# Patient Record
Sex: Male | Born: 1945 | Race: White | Hispanic: No | Marital: Married | Smoking: Never smoker
Health system: Southern US, Academic
[De-identification: ages and names within clinical notes are randomized; demographics above are authoritative.]

## PROBLEM LIST (undated history)

## (undated) DIAGNOSIS — C4492 Squamous cell carcinoma of skin, unspecified: Secondary | ICD-10-CM

## (undated) DIAGNOSIS — R001 Bradycardia, unspecified: Secondary | ICD-10-CM

## (undated) DIAGNOSIS — E041 Nontoxic single thyroid nodule: Secondary | ICD-10-CM

## (undated) DIAGNOSIS — E119 Type 2 diabetes mellitus without complications: Secondary | ICD-10-CM

## (undated) DIAGNOSIS — I10 Essential (primary) hypertension: Secondary | ICD-10-CM

## (undated) DIAGNOSIS — I719 Aortic aneurysm of unspecified site, without rupture: Secondary | ICD-10-CM

## (undated) DIAGNOSIS — K219 Gastro-esophageal reflux disease without esophagitis: Secondary | ICD-10-CM

## (undated) DIAGNOSIS — M5030 Other cervical disc degeneration, unspecified cervical region: Secondary | ICD-10-CM

## (undated) DIAGNOSIS — I519 Heart disease, unspecified: Secondary | ICD-10-CM

## (undated) HISTORY — PX: KNEE ARTHROSCOPY W/ MENISCAL REPAIR: SHX1877

## (undated) HISTORY — DX: Nontoxic single thyroid nodule: E04.1

## (undated) HISTORY — DX: Bradycardia, unspecified: R00.1

## (undated) HISTORY — DX: Type 2 diabetes mellitus without complications: E11.9

## (undated) HISTORY — PX: SKIN LESION EXCISION: SHX2412

## (undated) HISTORY — DX: Squamous cell carcinoma of skin, unspecified: C44.92

## (undated) HISTORY — DX: Essential (primary) hypertension: I10

## (undated) HISTORY — PX: COLONOSCOPY: SHX174

## (undated) HISTORY — DX: Heart disease, unspecified: I51.9

## (undated) HISTORY — DX: Gastro-esophageal reflux disease without esophagitis: K21.9

## (undated) HISTORY — DX: Other cervical disc degeneration, unspecified cervical region: M50.30

---

## 1996-07-15 ENCOUNTER — Other Ambulatory Visit (HOSPITAL_COMMUNITY): Payer: Self-pay

## 2019-08-30 IMAGING — MR MRI UPPER EXTREMITY W/O RT
4 of 7 series · 19 of 40 positions shown · IV contrast (gadolinium)
Comparison: Radiographs from outside facility dated 08/25/2019.

EXAM:  MRI UPPER EXTREMITY W/O RT
INDICATION: Lytic bone lesions. Remote history of melanoma.
TECHNIQUE: Multiplanar multisequential MRI of the right forearm was performed without gadolinium contrast.

[Series 10: T2 fat-sat · axial · right · 6.5mm · 0.39mm/px · z∈[-152,+125]mm · 8 of 38 slices shown]
[im 1/38]
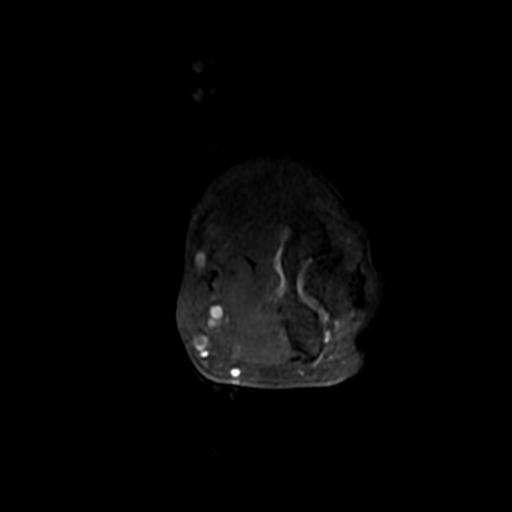
[im 6/38]
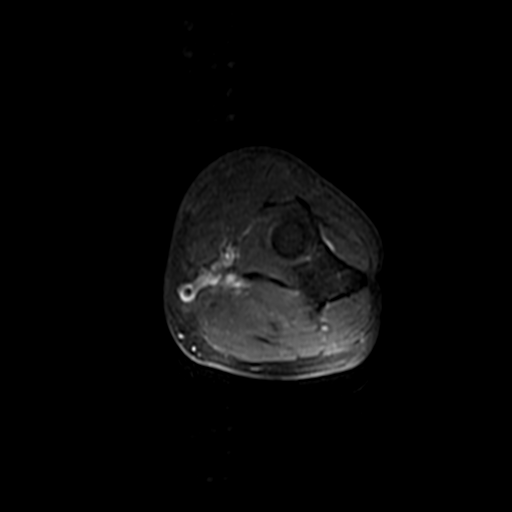
[im 11/38]
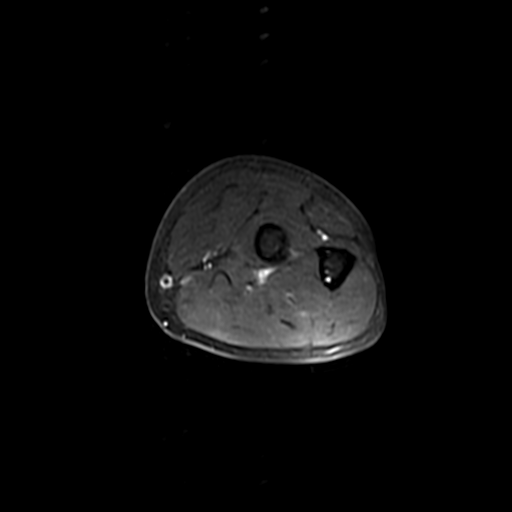
[im 16/38]
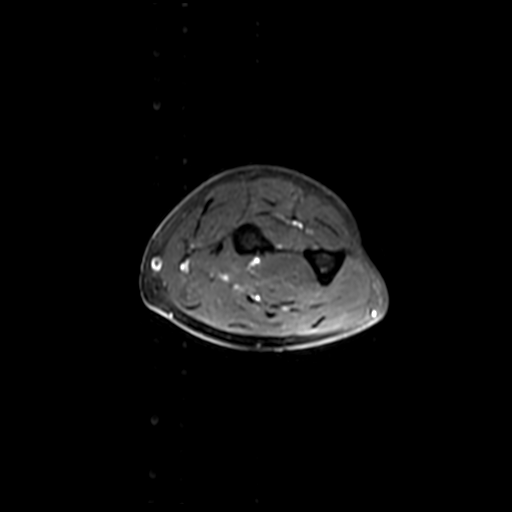
[im 22/38]
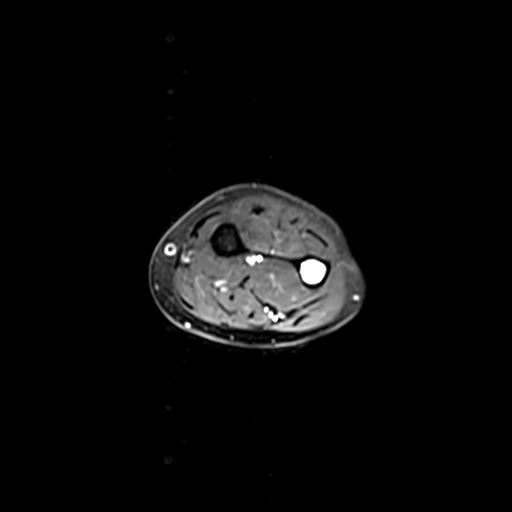
[im 27/38]
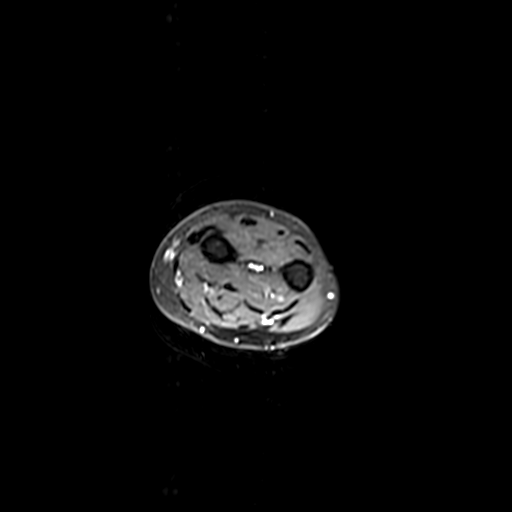
[im 32/38]
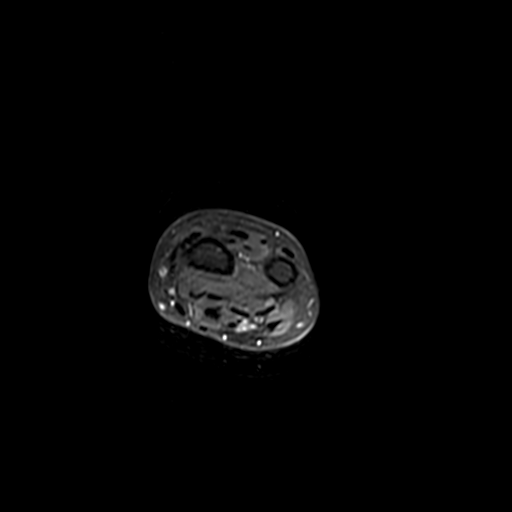
[im 38/38]
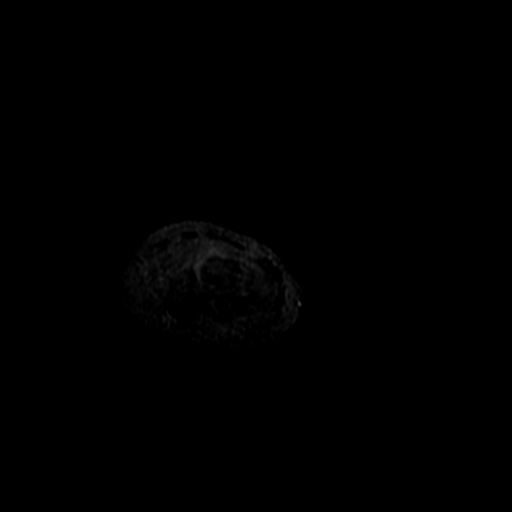

[Series 11: T1 · axial · right · 6.5mm · 0.39mm/px · z∈[-152,+80]mm · 5 of 38 slices shown (1 of 3)]
[im 1/38]
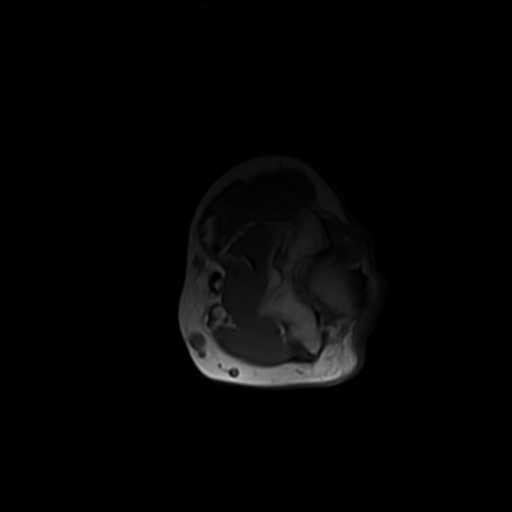
[im 6/38]
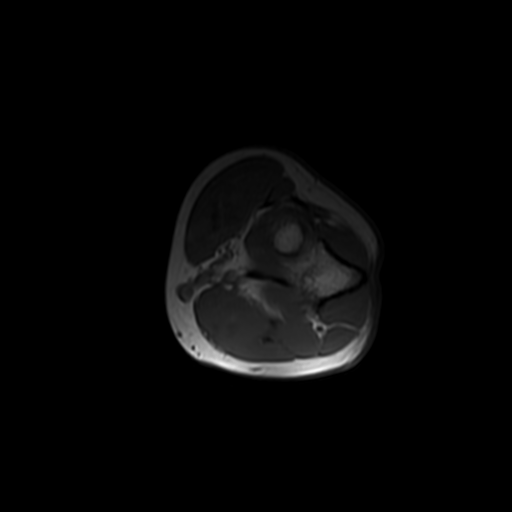
[im 11/38]
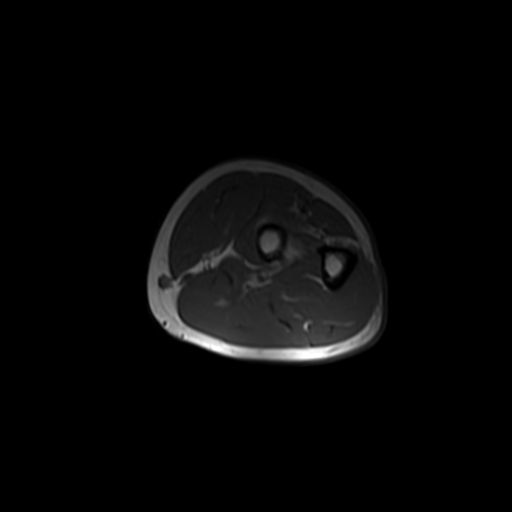
[im 22/38]
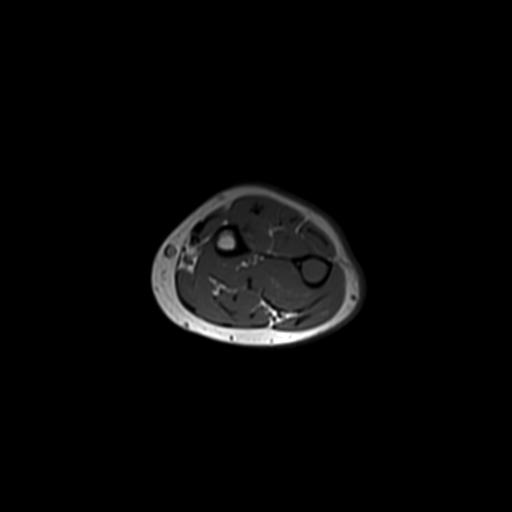
[im 32/38]
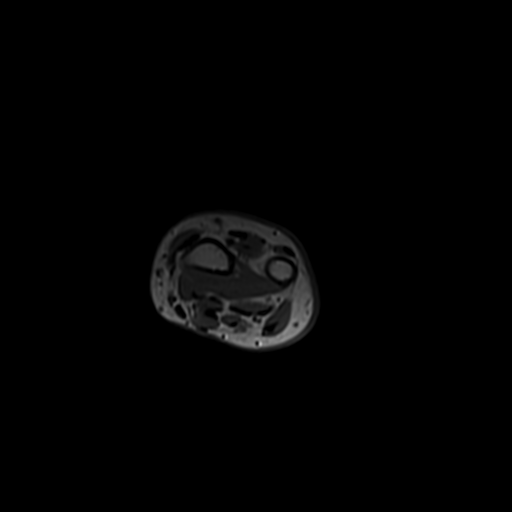

[Series 12: T1 · sagittal · right · 5.0mm · 0.62mm/px · 3 of 20 slices shown (2 of 3)]
[im 1/20]
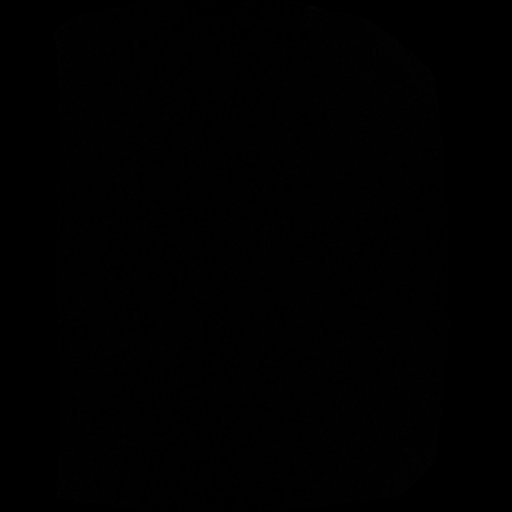
[im 10/20]
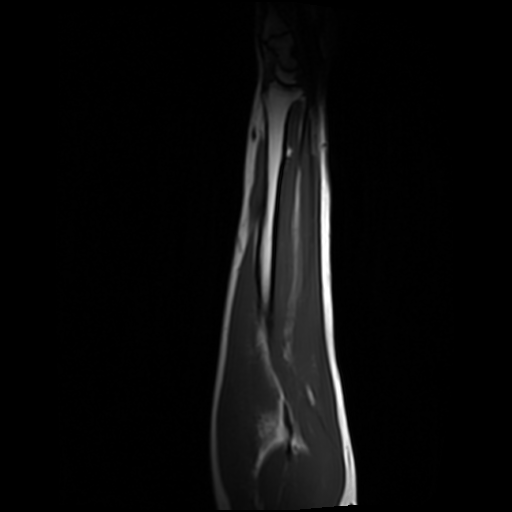
[im 20/20]
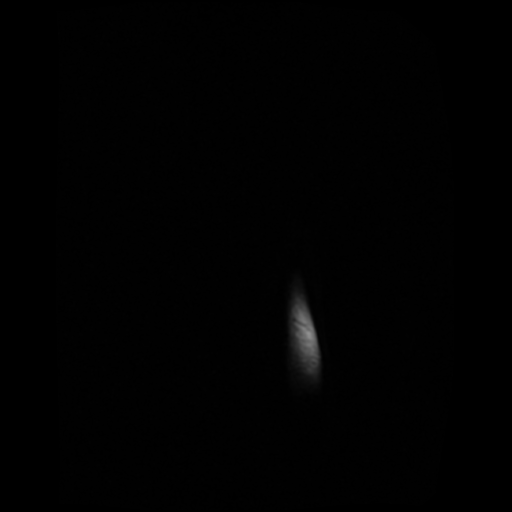

[Series 14: T1 · coronal · right · 5.0mm · 0.64mm/px · 3 of 20 slices shown (3 of 3)]
[im 1/20]
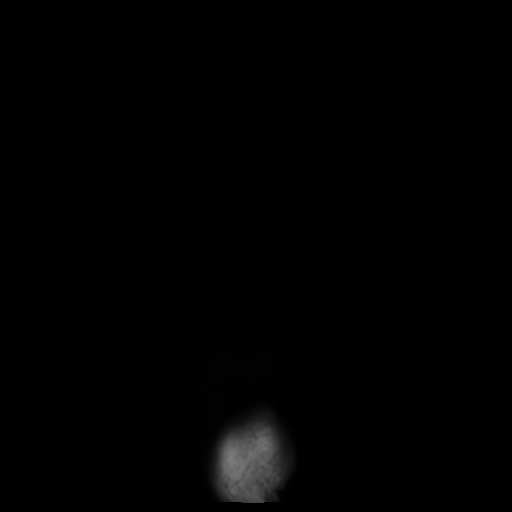
[im 10/20]
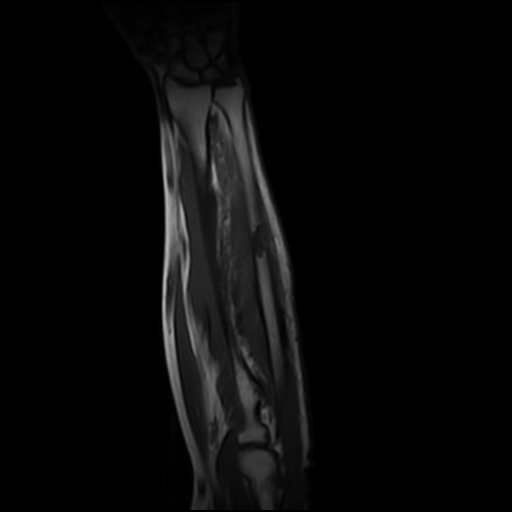
[im 20/20]
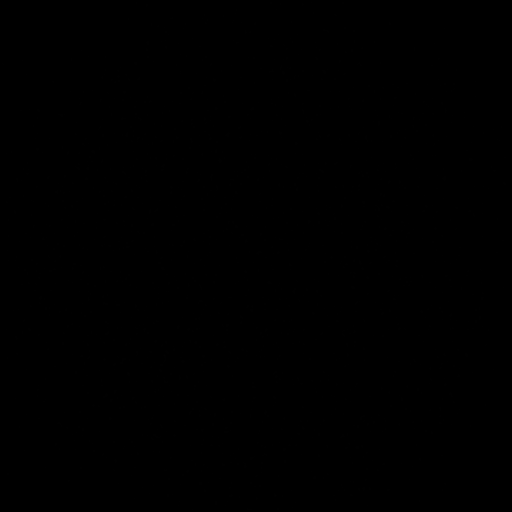

[19 of 40 positions shown; findings below may reference images not displayed]

FINDINGS: There is a 2 cm abnormal low T1 and intermediate T2 signal intensity lesion within the mid ulnar shaft. There is no edema within the adjacent bone and there is no invasion of the surrounding soft tissues. The bony cortex appears intact.
IMPRESSION: Indeterminate 2 cm lesion within the mid ulnar shaft as detailed above. No aggressive imaging features are seen on this limited noncontrast exam. Continued follow-up is recommended.

## 2019-08-30 IMAGING — CT CT THORAX WITHOUT CONTRAST
2 of 3 series · 15 of 36 positions shown, 18 images · non-contrast
Comparison: None available.

EXAM:  CT THORAX WITHOUT CONTRAST

EXAM: CT ABDOMEN WITHOUT CONTRAST
INDICATION: Lytic bone lesion of the forearm.
TECHNIQUE: Axial CT imaging of the chest and abdomen was performed with oral contrast only. Images were reviewed in multiple windows and projections. Exam was performed using 1 or more of the following dose reduction techniques: Automated exposure control, adjustment of the mA and/or kV according to patient size, or the use of iterative reconstruction technique.

[ax · axial · 0.79mm/px · z∈[+683,+979]mm · 12 of 174 slices shown, 15 images]
[im 13/174  mediastinal]
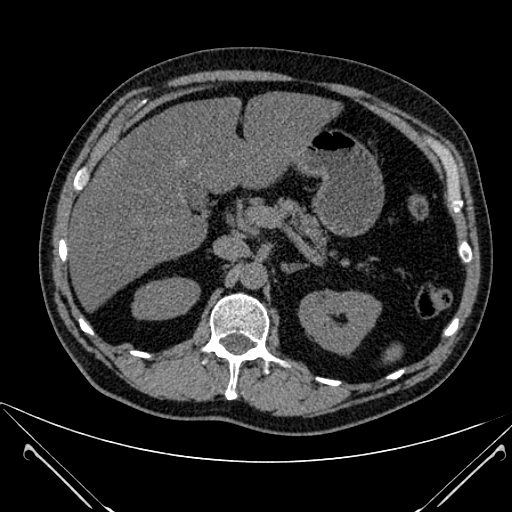
[im 13/174  lung]
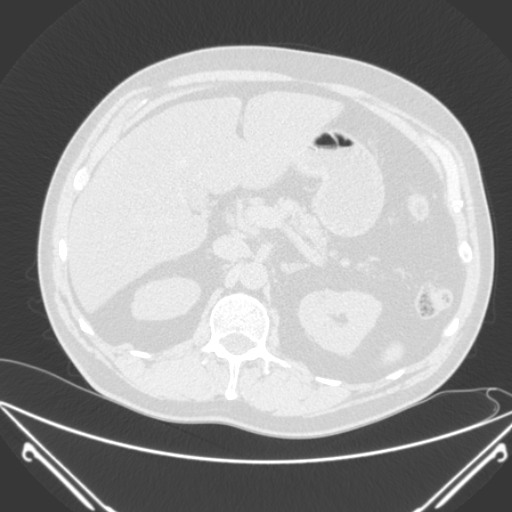
[im 26/174  lung]
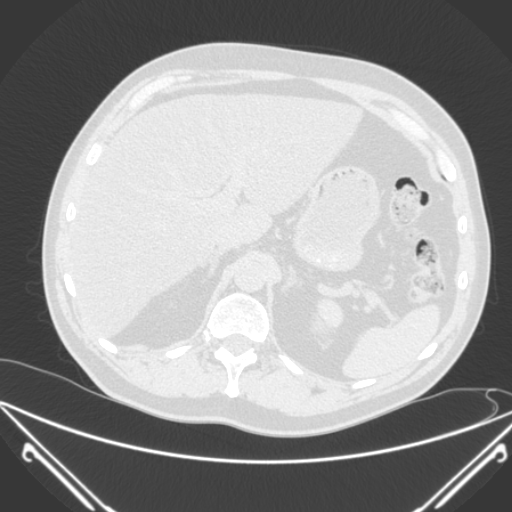
[im 39/174  lung]
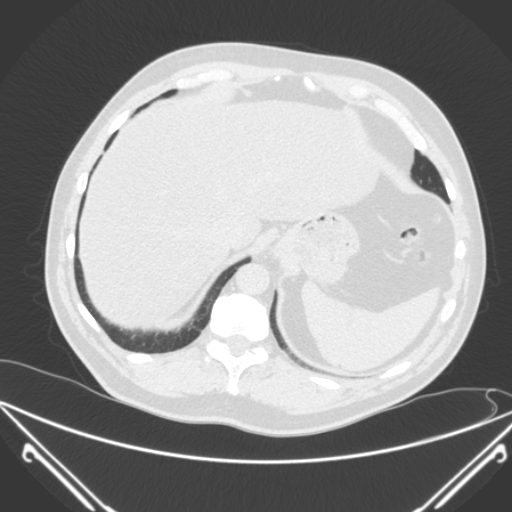
[im 52/174  lung]
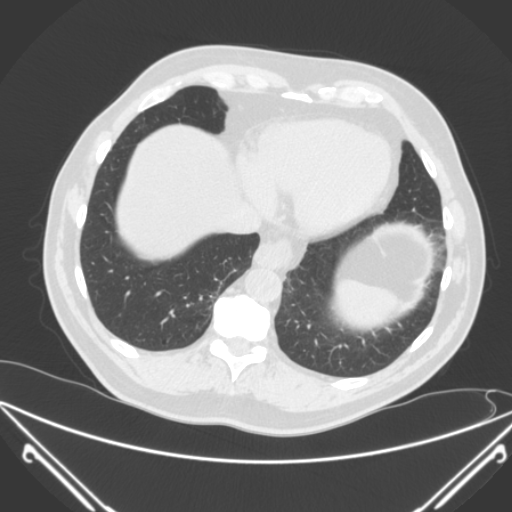
[im 65/174  mediastinal]
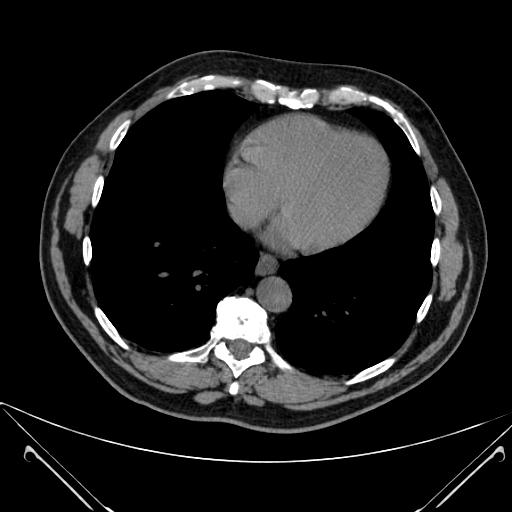
[im 65/174  lung]
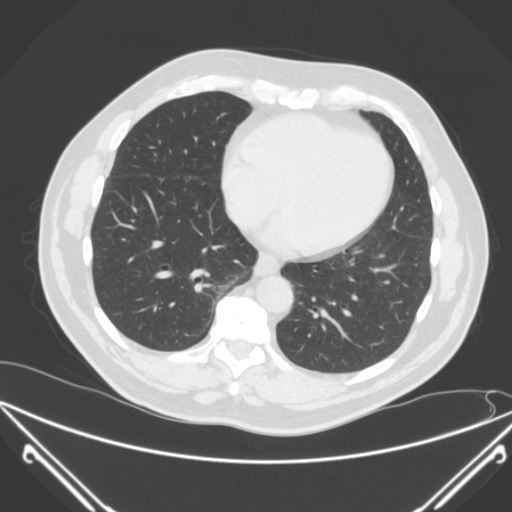
[im 77/174  lung]
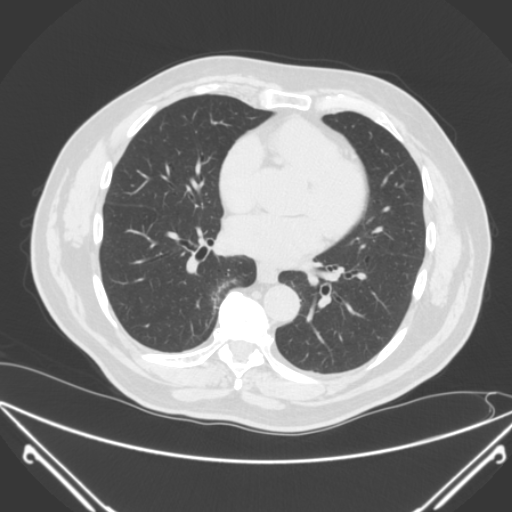
[im 97/174  lung]
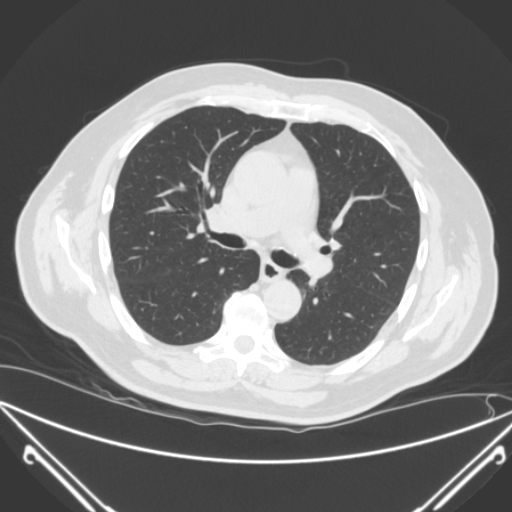
[im 109/174  lung]
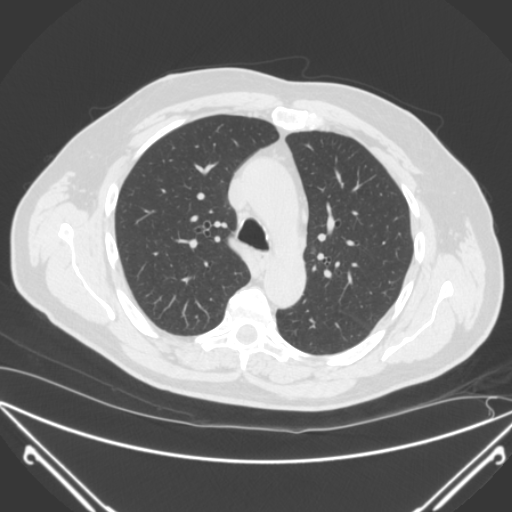
[im 122/174  mediastinal]
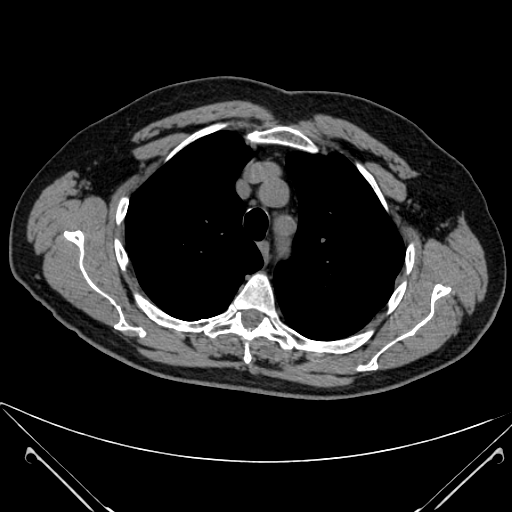
[im 122/174  lung]
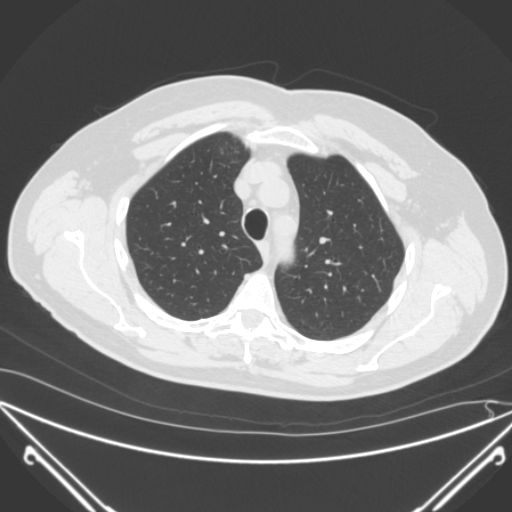
[im 135/174  lung]
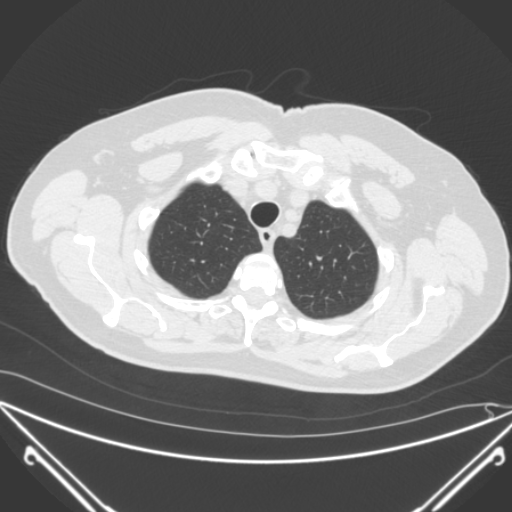
[im 148/174  lung]
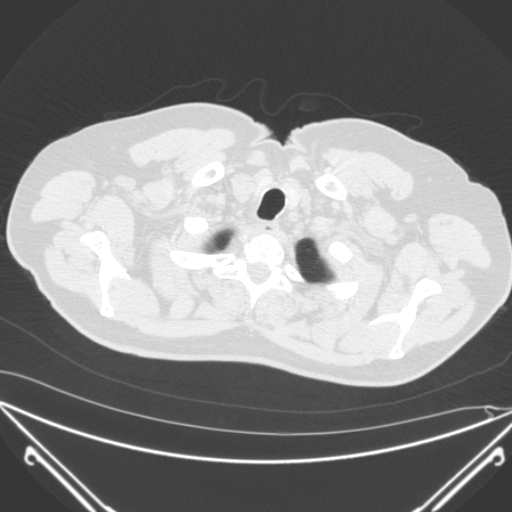
[im 161/174  lung]
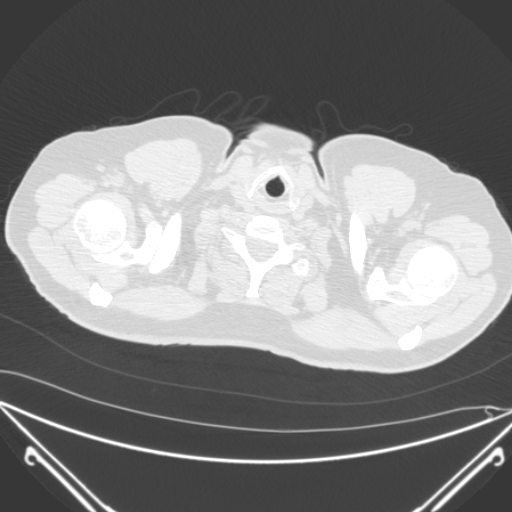

[cor · coronal · 0.79mm/px · 3 of 127 slices shown]
[im 26/127  lung]
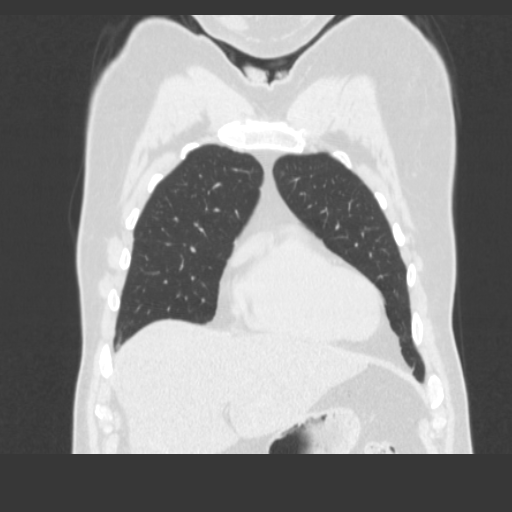
[im 51/127  lung]
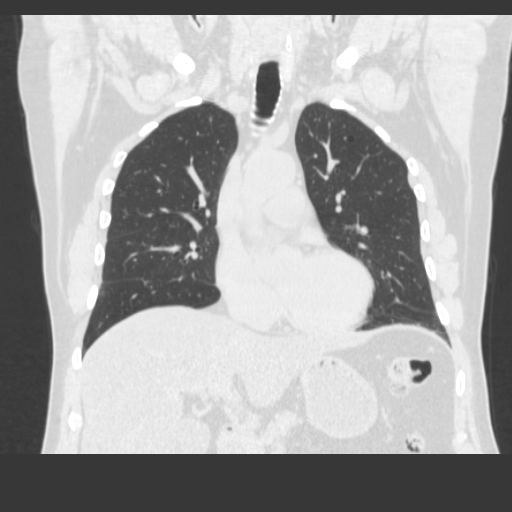
[im 76/127  lung]
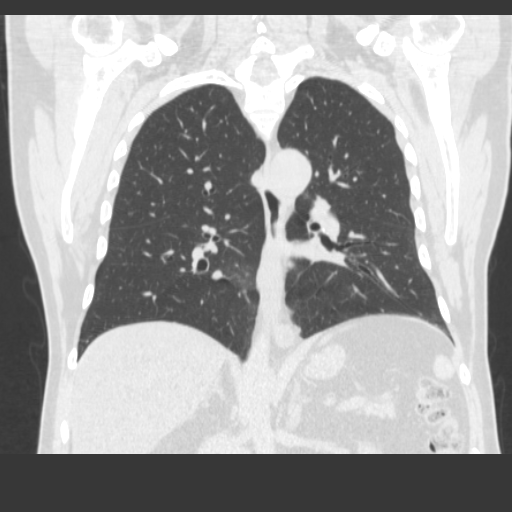

[15 of 36 positions shown; findings below may reference images not displayed]

FINDINGS: Chest

Thyroid gland appears unremarkable. Trachea and mainstem bronchi are patent. There is no mediastinal or axillary adenopathy. Evaluation for hilar adenopathy is limited due to the lack of intravenous contrast. There is no pleural or pericardial effusion. There is no lung consolidation. There is no suspicious lung nodule. There is a 4.2 cm ascending aortic aneurysm. There are scattered coronary artery calcifications. There is a small hiatal hernia. Mild multilevel arthritic changes are noted within the thoracic spine. No suspicious osseous abnormality is noted.

Abdomen

Liver is fatty and borderline enlarged measuring 17.5 cm in maximum craniocaudal dimension. There is a 9 mm right kidney lower pole angiomyolipoma. Unenhanced gallbladder, spleen, pancreas, adrenal glands and left kidney are normal. Visualized bowel loops are normal in course and caliber, there is no obstruction or free air. Appendix is normal. There is no ascites or adenopathy. There are scattered vascular calcifications. There is severe degenerative disc disease is seen at L5-S1 level. No suspicious osseous lesion is noted.
IMPRESSION: No definite evidence of malignancy. No suspicious osseous lesion. 

No lung consolidation, pleural or pericardial effusion. 

A 4.2 cm ascending aortic aneurysm. 

Fatty and borderline enlarged liver.

## 2019-08-30 IMAGING — CT CT ABDOMEN WITHOUT CONTRAST
2 of 3 series · 17 of 46 positions shown, 19 images · non-contrast
Comparison: None available.

EXAM:  CT THORAX WITHOUT CONTRAST

EXAM: CT ABDOMEN WITHOUT CONTRAST
INDICATION: Lytic bone lesion of the forearm.
TECHNIQUE: Axial CT imaging of the chest and abdomen was performed with oral contrast only. Images were reviewed in multiple windows and projections. Exam was performed using 1 or more of the following dose reduction techniques: Automated exposure control, adjustment of the mA and/or kV according to patient size, or the use of iterative reconstruction technique.

[ax · axial · 0.77mm/px · z∈[+541,+783]mm · 14 of 141 slices shown, 16 images]
[im 10/141  soft-tissue]
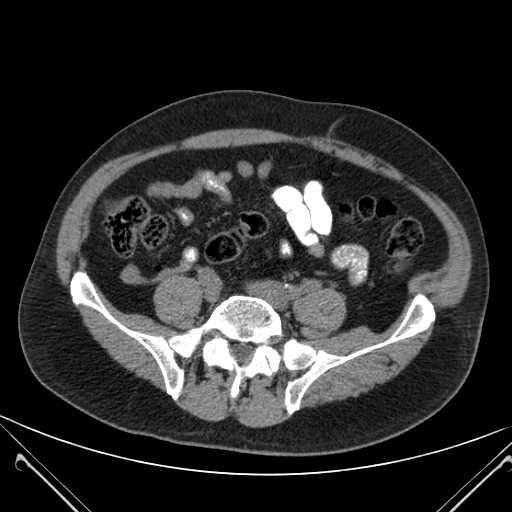
[im 10/141  bone]
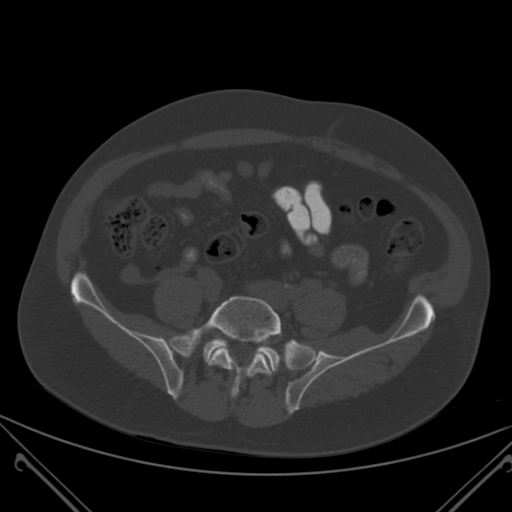
[im 19/141  soft-tissue]
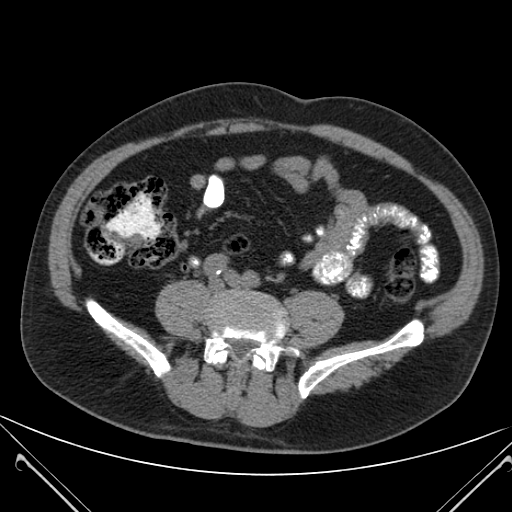
[im 28/141  soft-tissue]
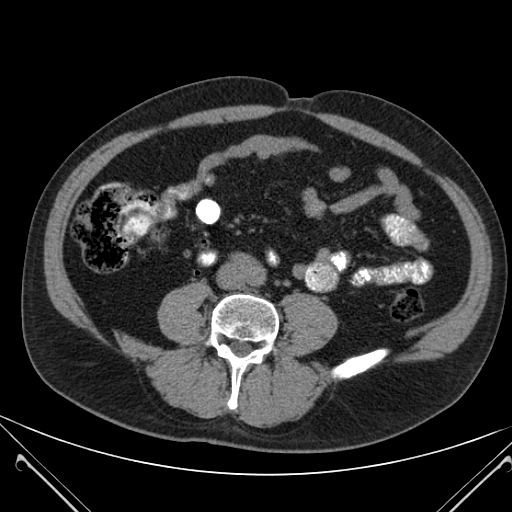
[im 37/141  soft-tissue]
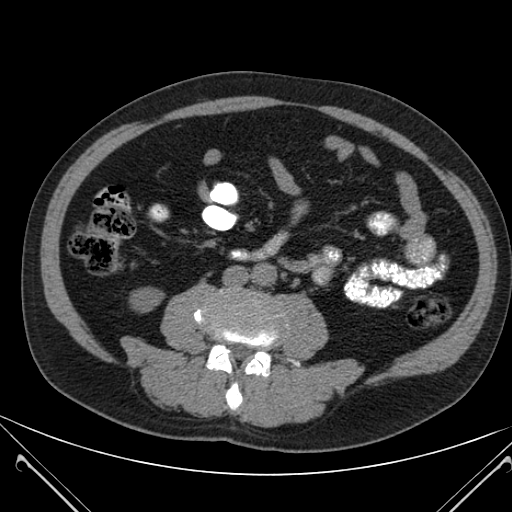
[im 46/141  soft-tissue]
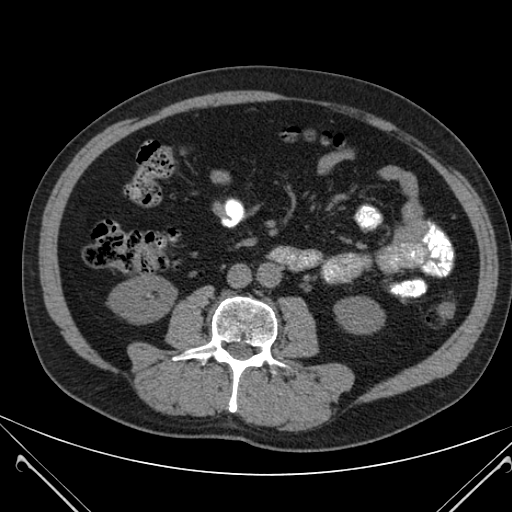
[im 55/141  soft-tissue]
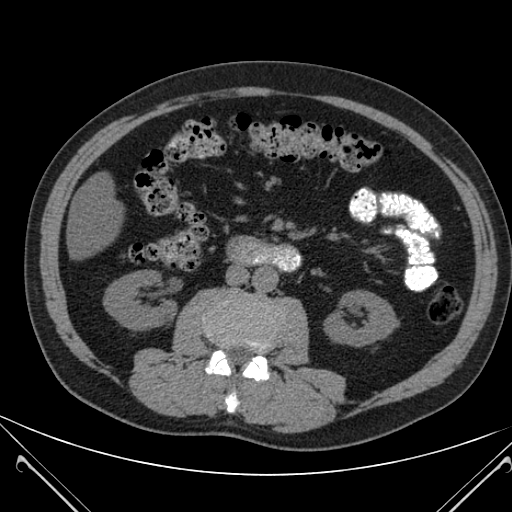
[im 64/141  soft-tissue]
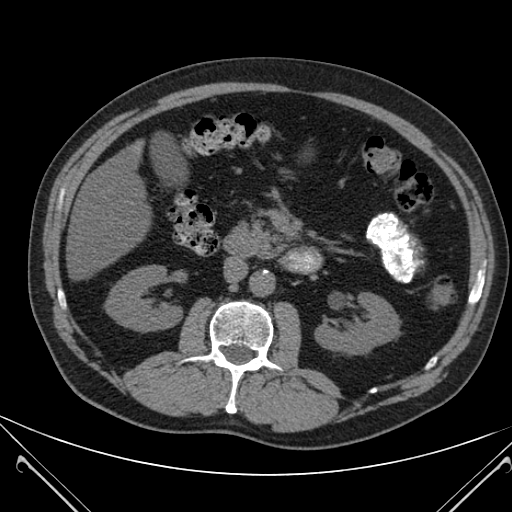
[im 77/141  soft-tissue]
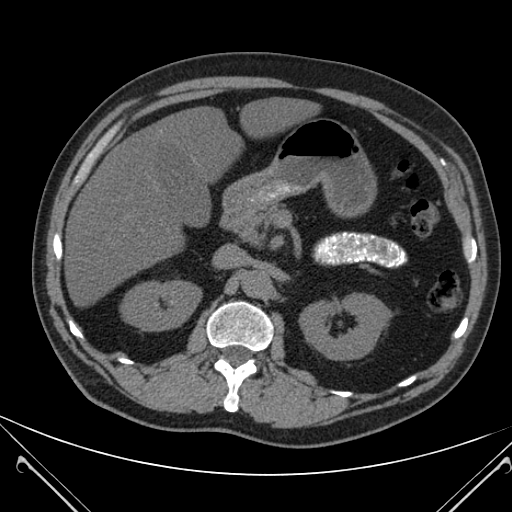
[im 86/141  soft-tissue]
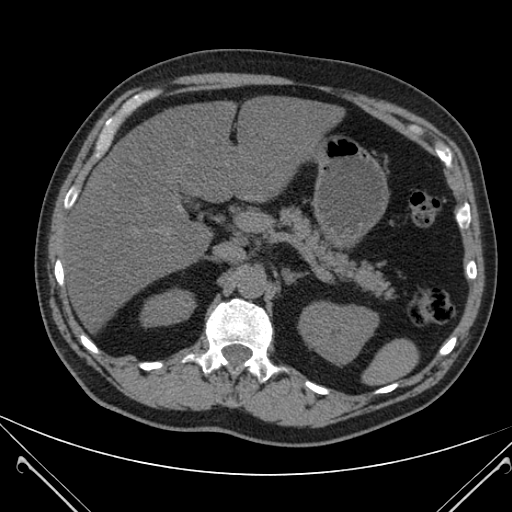
[im 86/141  bone]
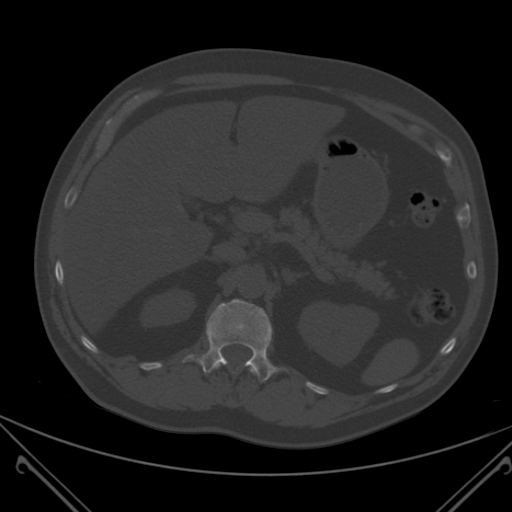
[im 95/141  soft-tissue]
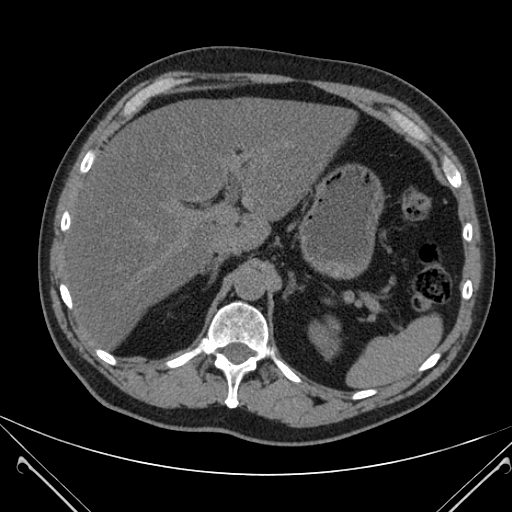
[im 104/141  soft-tissue]
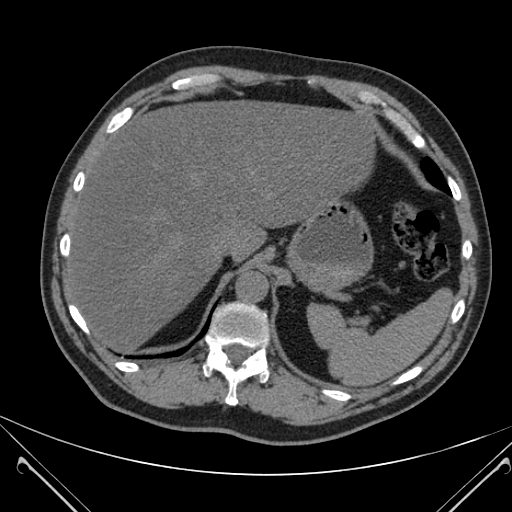
[im 113/141  soft-tissue]
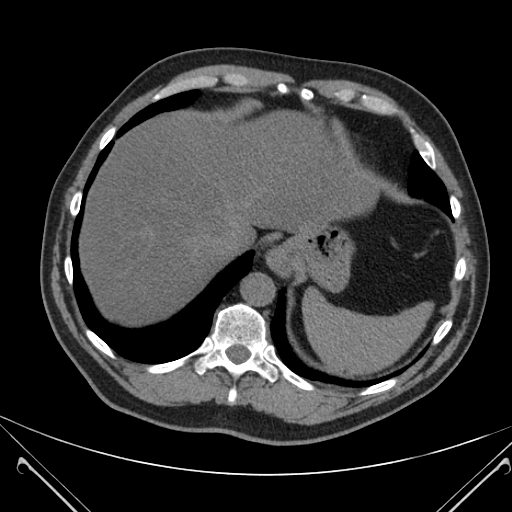
[im 122/141  soft-tissue]
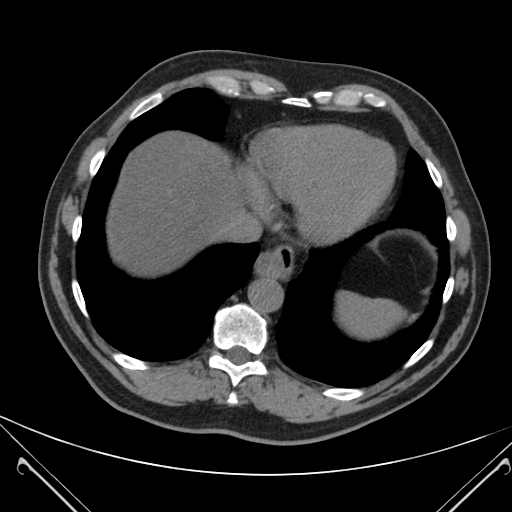
[im 131/141  soft-tissue]
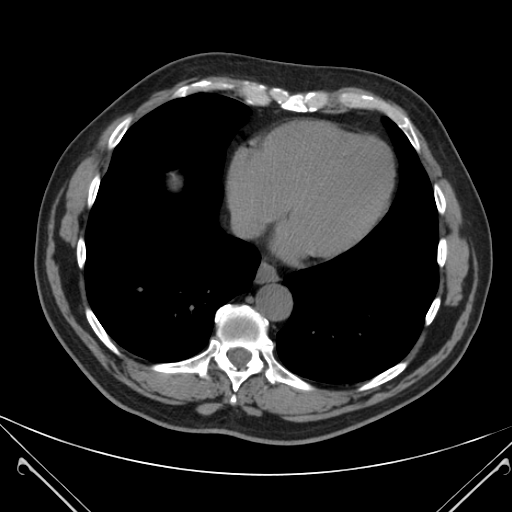

[cor · coronal · 0.70mm/px · 3 of 130 slices shown]
[im 44/130  soft-tissue]
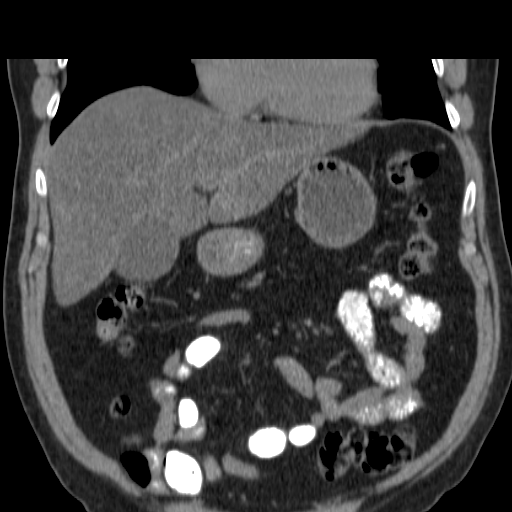
[im 58/130  soft-tissue]
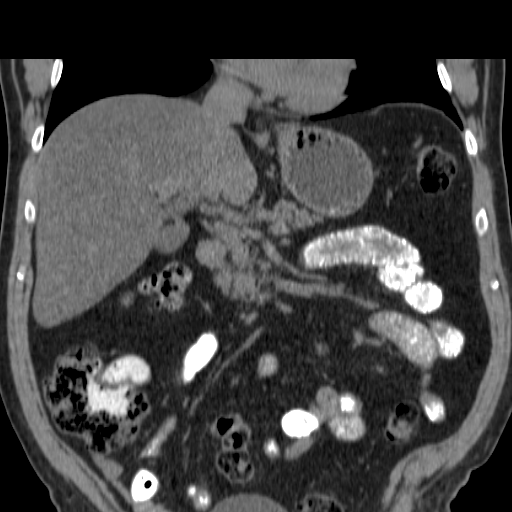
[im 72/130  soft-tissue]
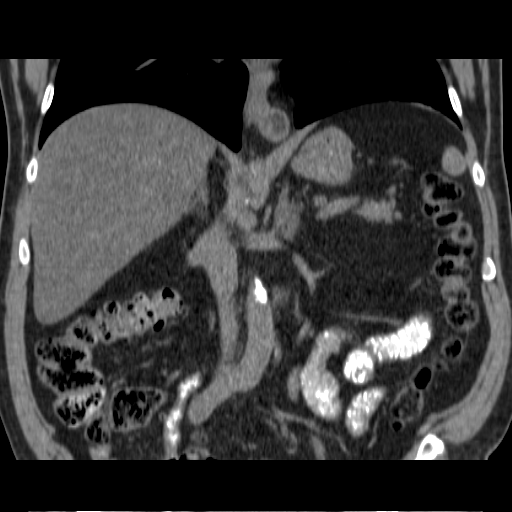

[17 of 46 positions shown; findings below may reference images not displayed]

FINDINGS: Chest

Thyroid gland appears unremarkable. Trachea and mainstem bronchi are patent. There is no mediastinal or axillary adenopathy. Evaluation for hilar adenopathy is limited due to the lack of intravenous contrast. There is no pleural or pericardial effusion. There is no lung consolidation. There is no suspicious lung nodule. There is a 4.2 cm ascending aortic aneurysm. There are scattered coronary artery calcifications. There is a small hiatal hernia. Mild multilevel arthritic changes are noted within the thoracic spine. No suspicious osseous abnormality is noted.

Abdomen

Liver is fatty and borderline enlarged measuring 17.5 cm in maximum craniocaudal dimension. There is a 9 mm right kidney lower pole angiomyolipoma. Unenhanced gallbladder, spleen, pancreas, adrenal glands and left kidney are normal. Visualized bowel loops are normal in course and caliber, there is no obstruction or free air. Appendix is normal. There is no ascites or adenopathy. There are scattered vascular calcifications. There is severe degenerative disc disease is seen at L5-S1 level. No suspicious osseous lesion is noted.
IMPRESSION: No definite evidence of malignancy. No suspicious osseous lesion. 

No lung consolidation, pleural or pericardial effusion. 

A 4.2 cm ascending aortic aneurysm. 

Fatty and borderline enlarged liver.

## 2019-10-17 IMAGING — MR MRI JOINT LOWER EXTREMITY WITHOUT CONTRAST LT
4 of 5 series · 20 of 40 positions shown · IV contrast (gadolinium)
Comparison: None available.

EXAM:  MRI JOINT LOWER EXTREMITY WITHOUT CONTRAST LT
INDICATION: Left knee pain.
TECHNIQUE: Multiplanar multisequential MRI of the left knee joint was performed without gadolinium contrast.

[Series 9: PD fat-sat · axial · 4.0mm · 0.37mm/px · z∈[-91,+40]mm · 8 of 30 slices shown (1 of 3)]
[im 1/30]
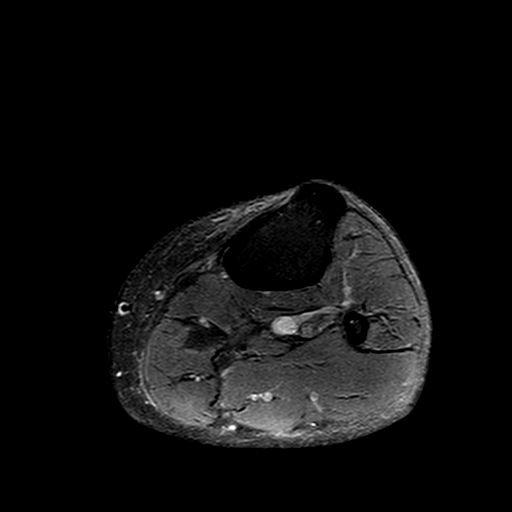
[im 5/30]
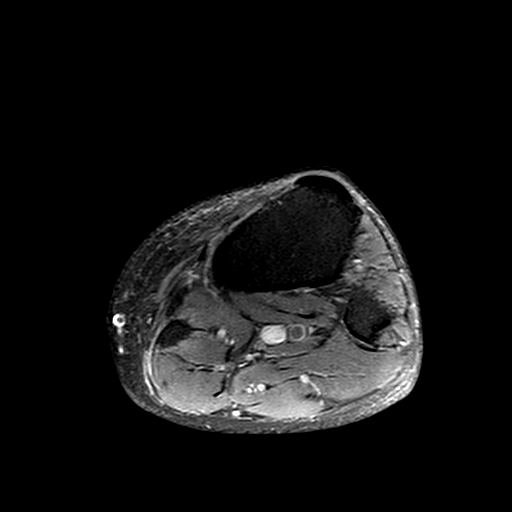
[im 9/30]
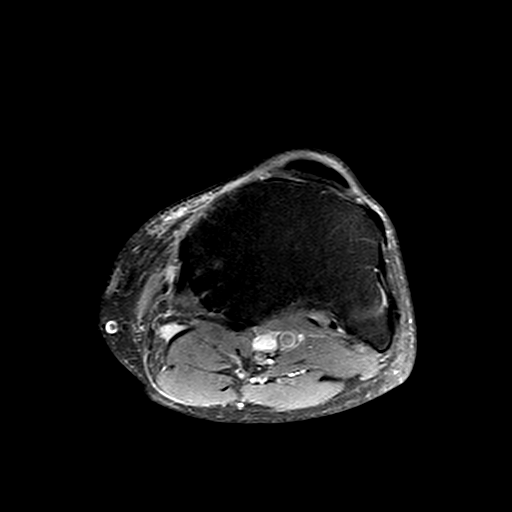
[im 13/30]
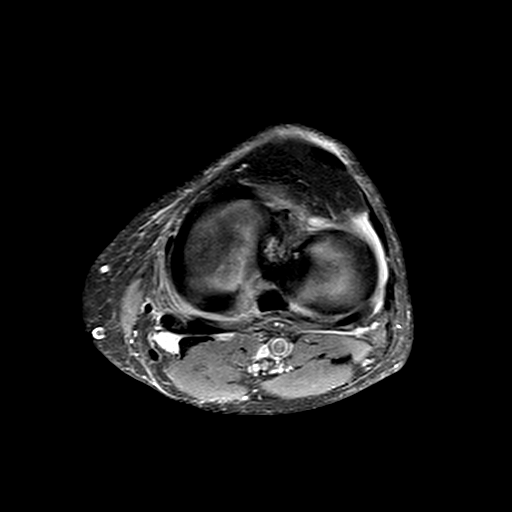
[im 17/30]
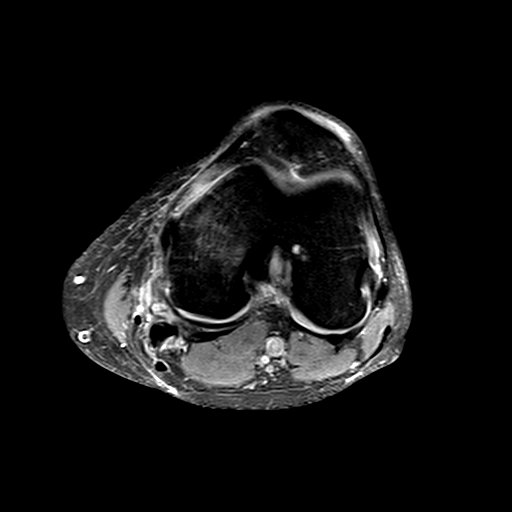
[im 21/30]
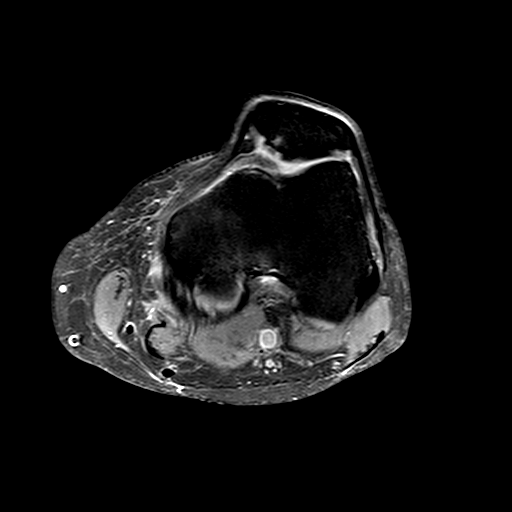
[im 25/30]
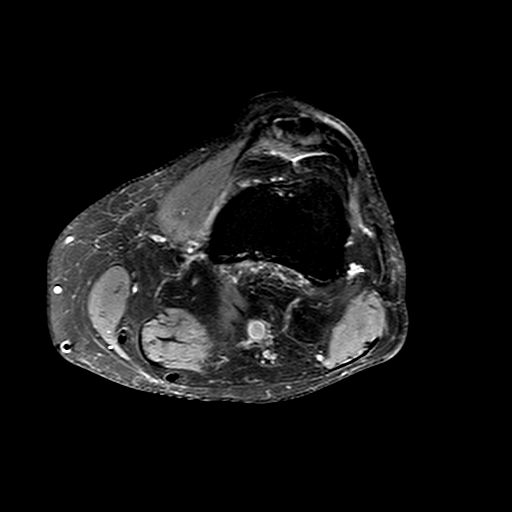
[im 30/30]
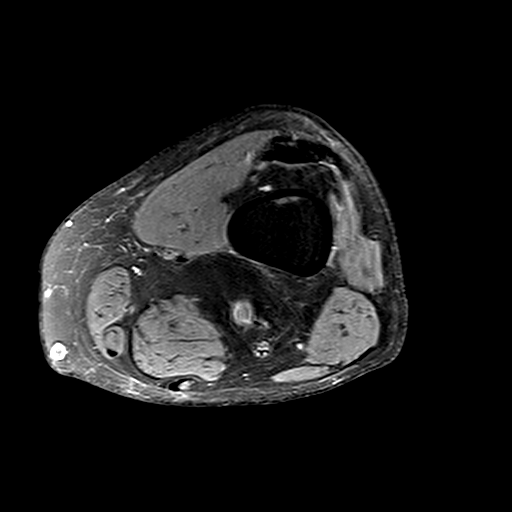

[Series 12: PD fat-sat · sagittal · 3.0mm · 0.29mm/px · 6 of 30 slices shown (2 of 3)]
[im 1/30]
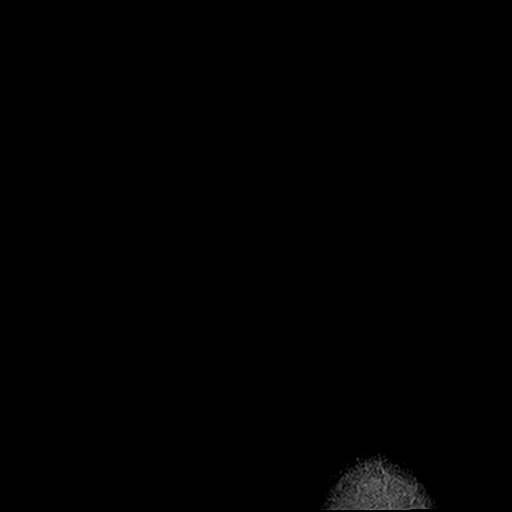
[im 5/30]
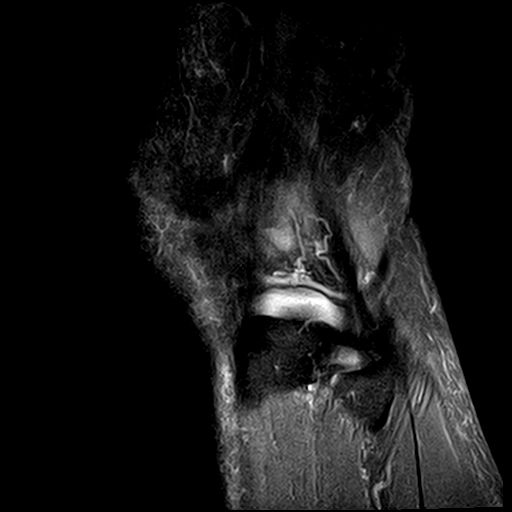
[im 9/30]
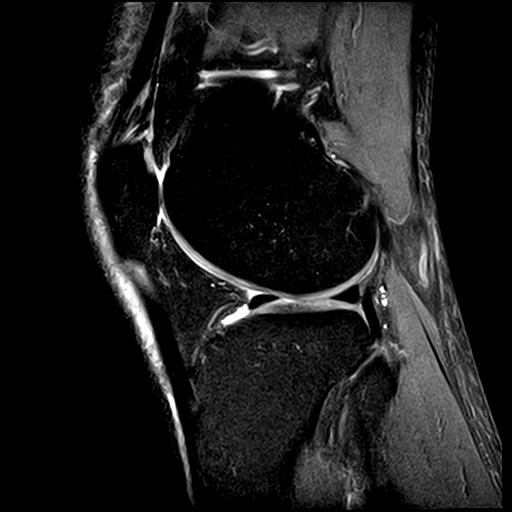
[im 13/30]
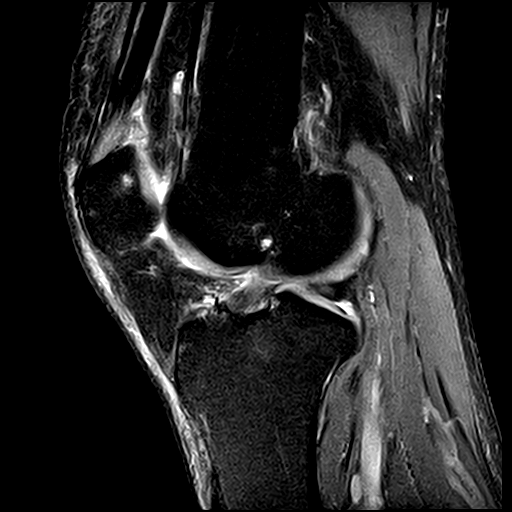
[im 17/30]
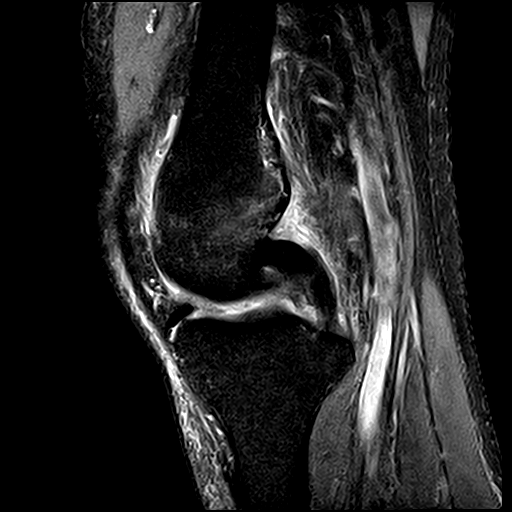
[im 25/30]
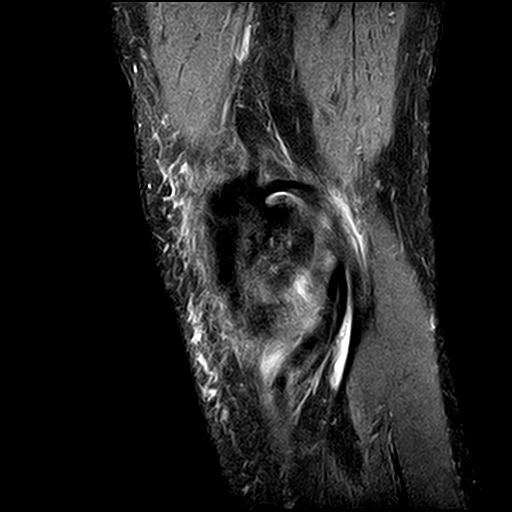

[Series 13: T1 · sagittal · 3.0mm · 0.29mm/px · 3 of 30 slices shown]
[im 5/30]
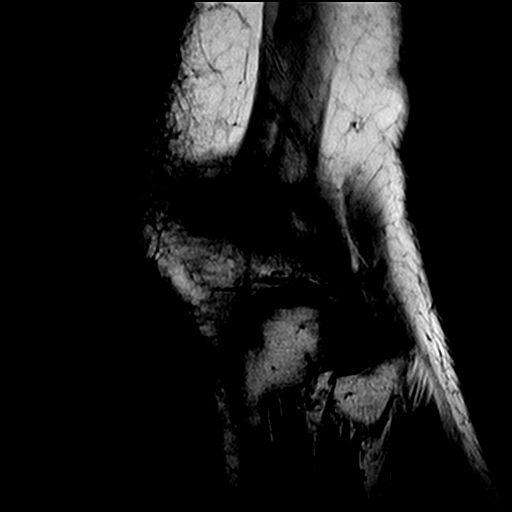
[im 17/30]
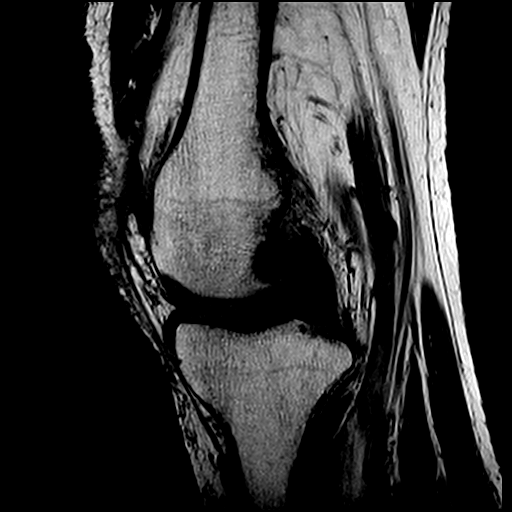
[im 25/30]
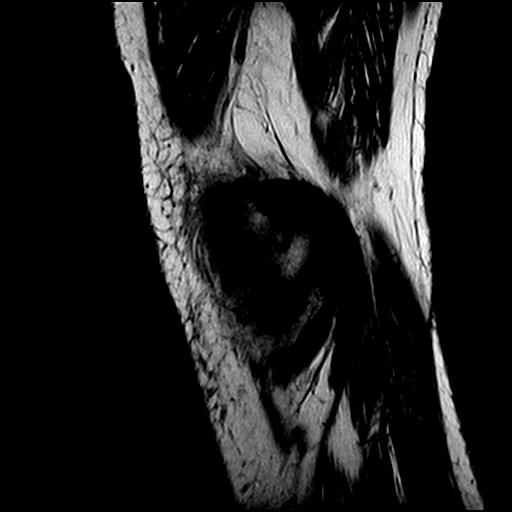

[Series 15: PD fat-sat · coronal · 3.0mm · 0.33mm/px · 3 of 27 slices shown (3 of 3)]
[im 4/27]
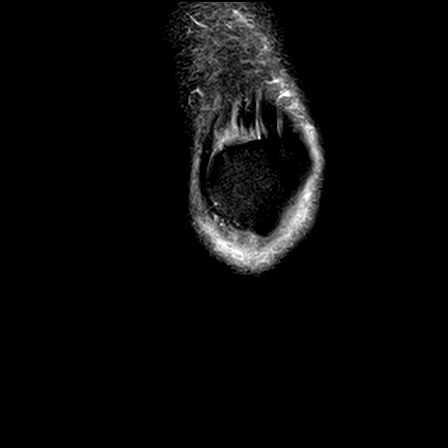
[im 15/27]
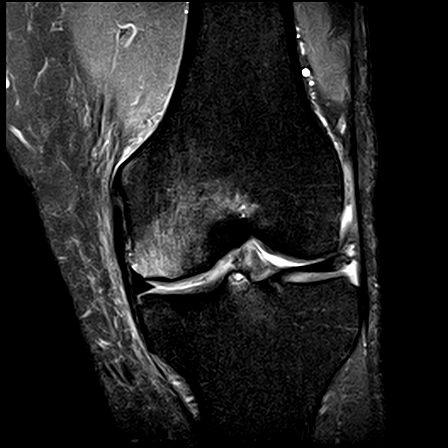
[im 23/27]
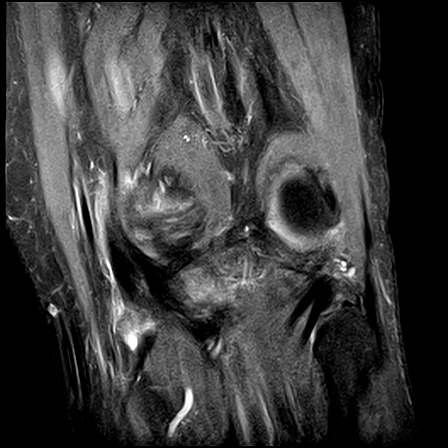

[20 of 40 positions shown; findings below may reference images not displayed]

FINDINGS: There is a complex medial meniscus root tear. Lateral meniscus, cruciate and collateral ligaments are intact, within normal limits in morphology and signal intensity. There is grade 3 chondromalacia of the medial tibiofemoral articulation with significant fraying. Grade 4 chondromalacia patella is also noted. There is moderate subarticular edema within the central weight-bearing portion of the medial femoral condyle and to a lesser extent within the medial tibial plateau. Extensor mechanism is intact. Capsular attachments appear unremarkable. There is no significant joint effusion. There is a small Baker's cyst.
IMPRESSION: Complex medial meniscus root tear. 

Grade 3 chondromalacia of the medial tibiofemoral articulation and grade 4 chondromalacia patella.

## 2021-07-05 IMAGING — US US SOFT TISSUES OF HEAD AND NECK
1 series · 14 of 25 positions shown · non-contrast
Comparison: None available.

﻿EXAM:  [address]   US SOFT TISSUES OF HEAD AND NECK
INDICATION: Posterior neck lump.

[Series 1: us soft tissues of head and neck · 14 of 37 slices shown]
[im 1/37]
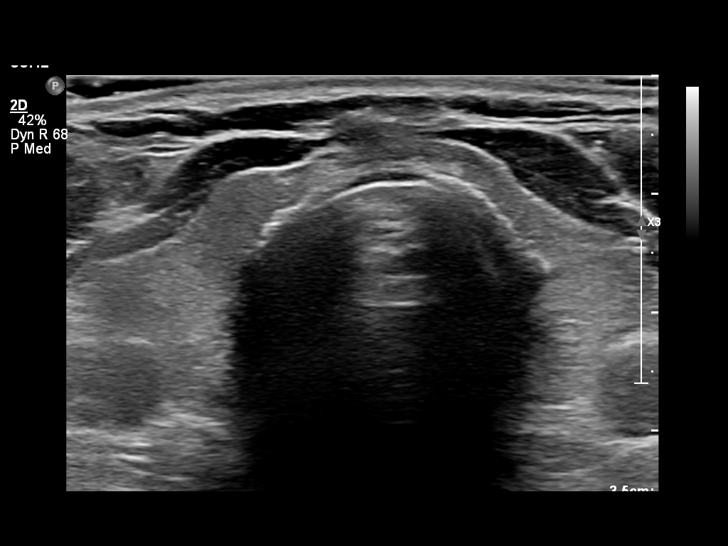
[im 4/37]
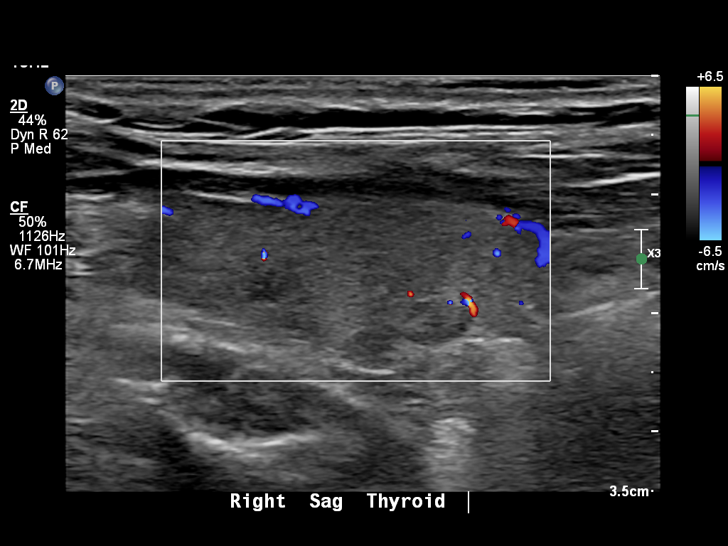
[im 7/37]
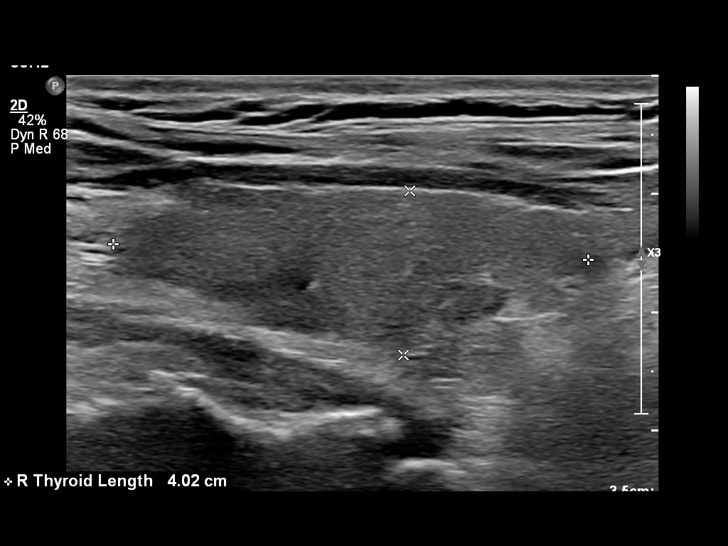
[im 10/37]
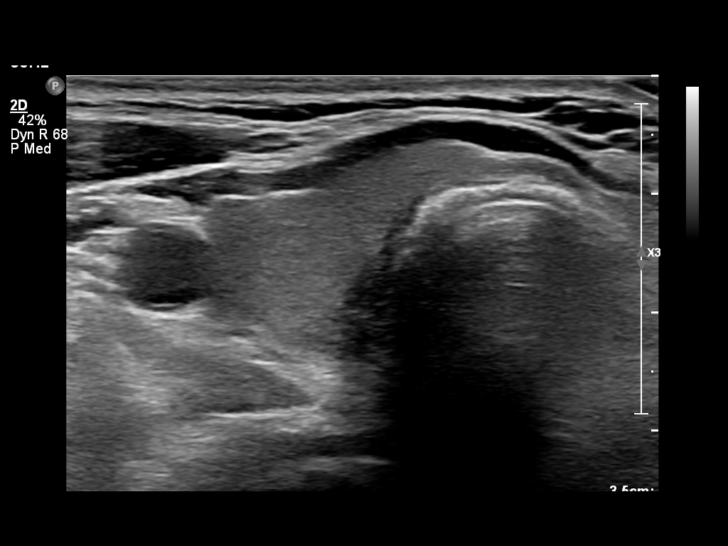
[im 13/37]
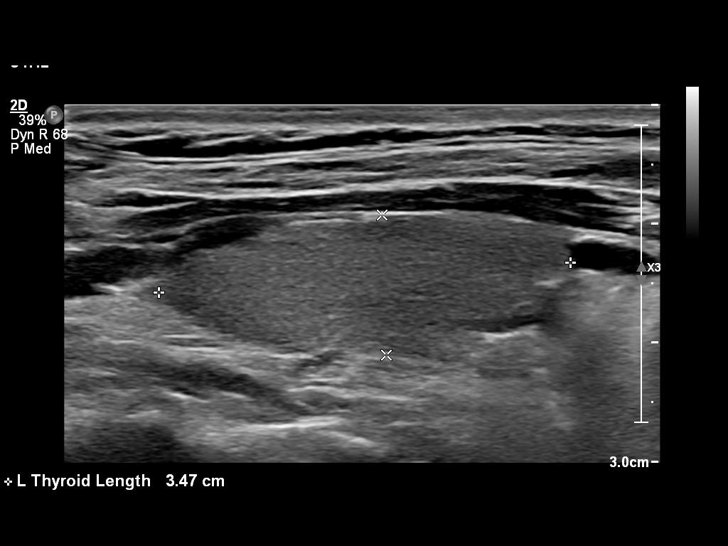
[im 14/37]
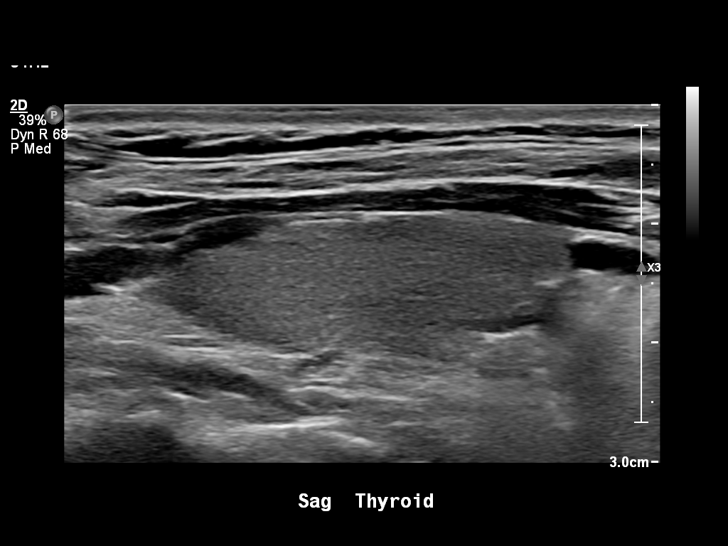
[im 17/37]
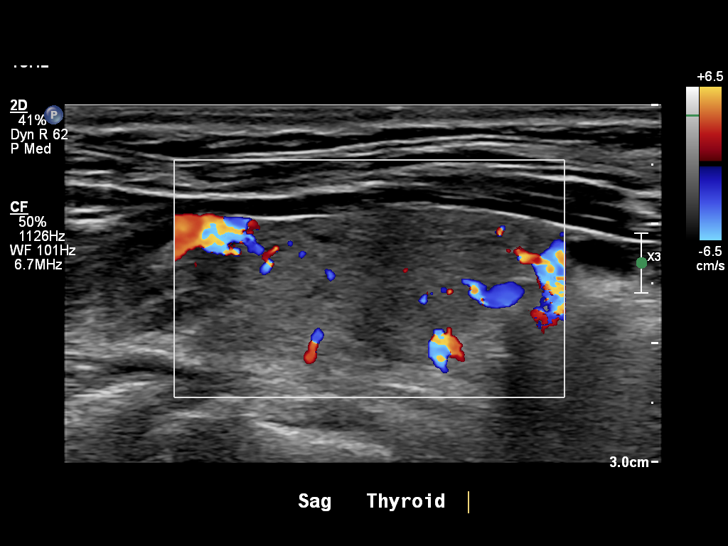
[im 20/37]
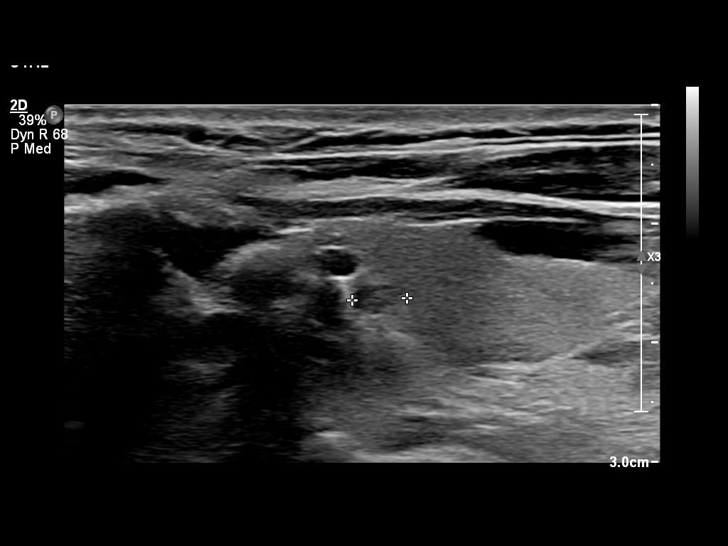
[im 23/37]
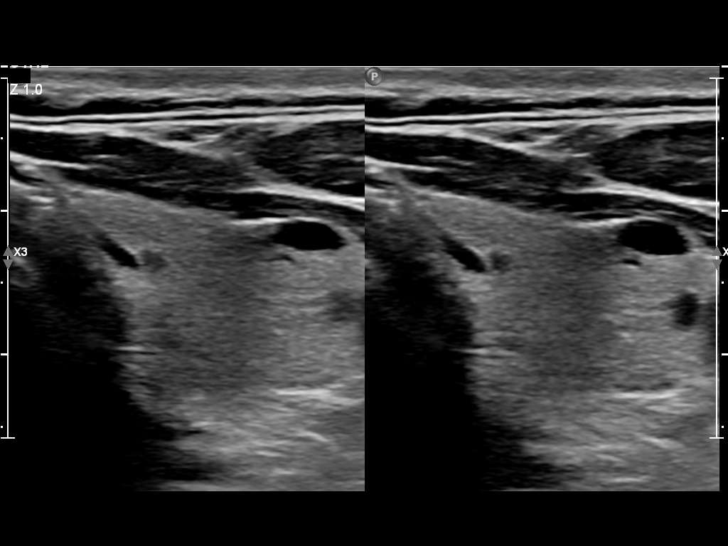
[im 25/37]
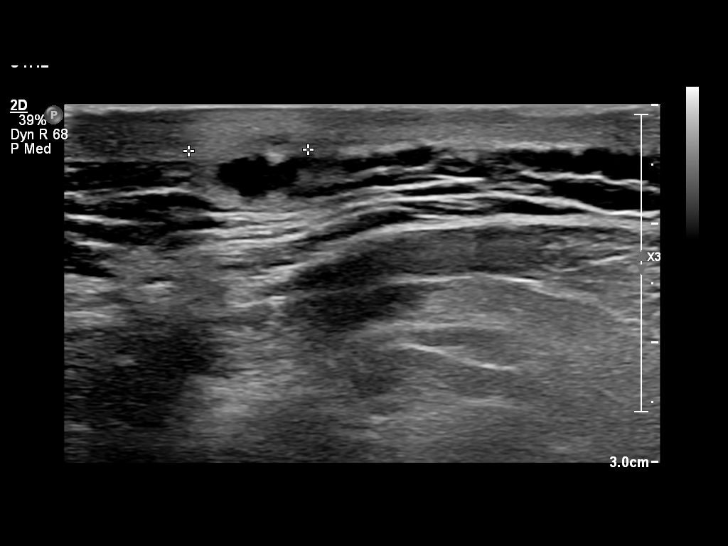
[im 28/37]
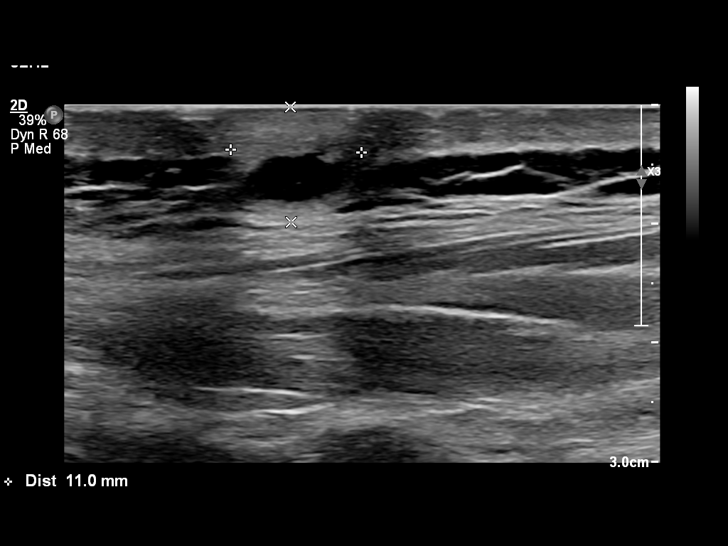
[im 31/37]
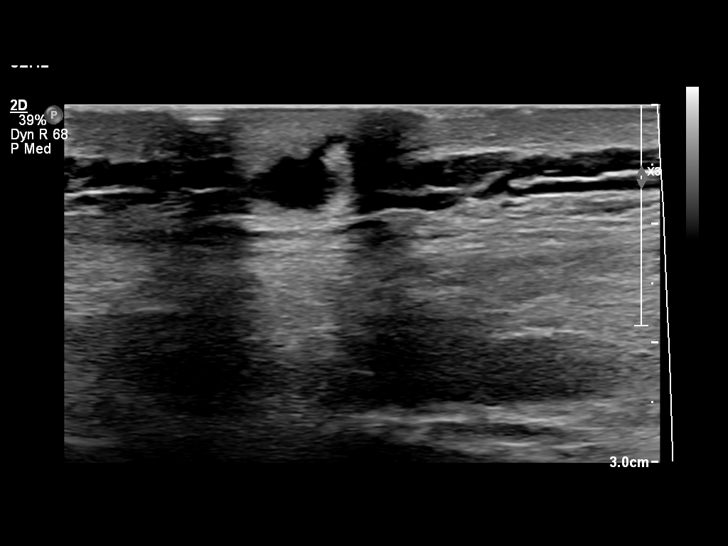
[im 34/37]
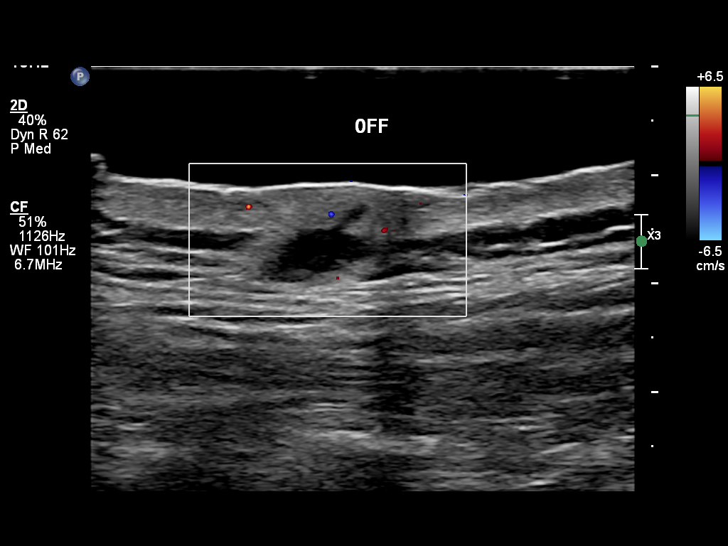
[im 37/37]
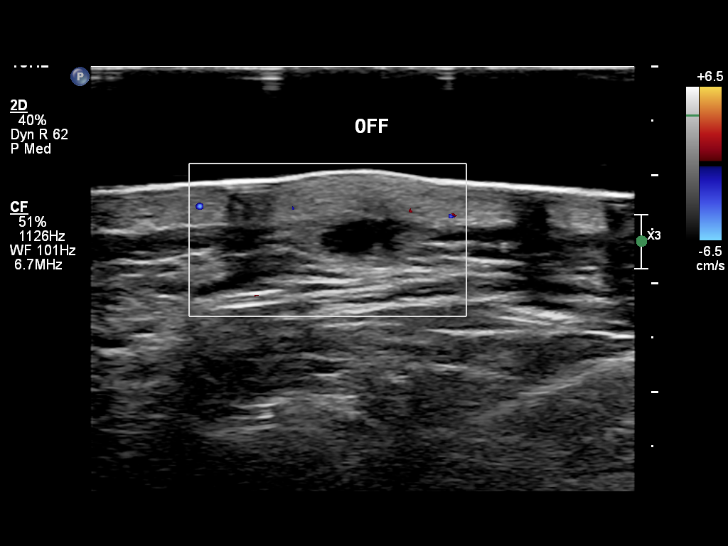

[14 of 25 positions shown; findings below may reference images not displayed]

FINDINGS: Thyroid gland is slightly heterogeneous in echogenicity. Right lobe measures 4 x 1.4 x 1.5 cm and left lobe measures 3.5 x 1.2 x 1.3 cm. There is a 3 mm hypoechoic nodule within the superior right lobe and a 5 mm hypoechoic nodule within the central left lobe.

There is a 10 x 11 mm subcutaneous hypoechoic area within the posterior neck, corresponding to patient's palpable lump.
IMPRESSION: 1. Subcentimeter thyroid nodules as detailed above. 

2. A small subcutaneous hypoechoic area within the posterior neck corresponding to patient's palpable lump. This may represent a sebaceous cyst or an infected hair follicle and continued clinical follow-up is recommended.

## 2022-02-24 ENCOUNTER — Ambulatory Visit (INDEPENDENT_AMBULATORY_CARE_PROVIDER_SITE_OTHER): Payer: Self-pay | Admitting: Family

## 2022-02-27 ENCOUNTER — Inpatient Hospital Stay
Admission: RE | Admit: 2022-02-27 | Discharge: 2022-02-27 | Disposition: A | Discharge status: Home / Self Care | Payer: Commercial Managed Care - PPO | Source: Ambulatory Visit

## 2022-02-27 ENCOUNTER — Other Ambulatory Visit (HOSPITAL_COMMUNITY): Payer: Self-pay

## 2022-02-27 ENCOUNTER — Other Ambulatory Visit: Payer: Self-pay

## 2022-02-27 DIAGNOSIS — M542 Cervicalgia: Secondary | ICD-10-CM

## 2022-04-23 ENCOUNTER — Other Ambulatory Visit: Payer: Self-pay

## 2022-04-23 ENCOUNTER — Ambulatory Visit (HOSPITAL_COMMUNITY)
Admission: RE | Admit: 2022-04-23 | Discharge: 2022-04-23 | Disposition: A | Discharge status: Still In Hospital | Payer: Commercial Managed Care - PPO | Source: Ambulatory Visit | Attending: FAMILY PRACTICE | Admitting: FAMILY PRACTICE

## 2022-04-23 DIAGNOSIS — M5090 Cervical disc disorder, unspecified, unspecified cervical region: Secondary | ICD-10-CM | POA: Insufficient documentation

## 2022-04-23 DIAGNOSIS — M436 Torticollis: Secondary | ICD-10-CM | POA: Insufficient documentation

## 2022-04-23 NOTE — PT Evaluation (Signed)
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 He did strike his head on  metal bar   several months. His symptoms are a little improved since start of symptoms. He has right sided neck pain. He has intermittent stiff neck upon     Mechanism of injury: pt struck his head on a bar    Previous episodes/treatments: no    Medications for this problem:  none    Diagnostic tests: x ray report documents: "No fracture of the cervical spine is identified. Degenerative disc disease is identified  at the C3-C4 and C5-C6 levels. If cervical radiculopathy is present clinically, further evaluation with  MRI would be helpful."    Patient goals: REDUCE PAIN and NORMALIZE FUNCTION    Occupation:  retired; volunteers at [employer] one time a week.     Next MD visit: this month    Pain location: suboccipital right side.                    Pain description: STABBING and fleeting. Pt denies radiating RUE pain.   His neck is stiff.     Pain frequency:  INTERMITTENT    Pain rating: Now 0   Best 0   Worst 10     Radiculopathy: no    Pain increases with:  right rotation or extension, quick motions of neck           decreases with :  avoiding those motions.     Sensation: normal sensation reported by pt in UEs    Weakness: pt denies weakness of RUE.    Sleep affected: NO    Headaches: NO    Dizziness: yes, intermittent with looking up    PLOF: mild stiffness of neck without pain    Subjective Functional Reports:    Sitting: WFL    Standing: WFL    Walking: WFL    Lifting: WFL                OBJECTIVE    ROM   right left   rotation 55 degrees 45 degrees pulls right   sidebend 15 15   flexion Full, pulls  right -------------------------   extension 45 -------------------------       Strength: WFL RUE:  pt has r bicep weakness due to old bicep injury    Palpation: intact sensation to LT in all dermatomes. 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 Straightening of cervical lordosis, prominent C7        Special tests + facet compression test right C1/C2, C2/3    Treatment provided: EVALUATION      ASSESSMENT    Impression: PT PRESENTS WITH SYMPTOMS CONSISTENT WITH UPPER CERVICAL FACET DYSFUNCTION.     Rehab potential: GOOD    Short Term Goals: 2 Weeks  -Independent home program. ROM / Stabilization / Self care / Activity modification.   -Knowledge of proper body mechanics.   -Decrease in pain. Intermittent vs. constant. Worst SPS rating less than 10.   -  Increase ROM / Strength  -Improve function. Decreased sleep disruption / Reduce HAS      Long Term Goals: 3-4 Weeks   -Abolish radicular symptoms. Worst SPS rating less than 3.  -Increase AROM  to [facility] to allow normal body mechanics with all mobility.  -Improve function.  Abolish HAS          PLAN  Patient will attend 2 times per week x 3-4 weeks. Therapy may include, but is not limited to THERAPEUTIC EXERCISES, MYOFASCIAL/JOINT MOBILIZATION, POSTURE/BODY MECHANICS, HOME INSTRUCTIONS, ULTRASOUND, and KINESIOTAPE    Plan for next visit: SUBCRANIAL JOINT MOBILIZATION, [place] TO R SUBCRANIAL FACETS; SUBCRANIAL ISOMETRICS       Evaluation complexity:   Personal factors impacting POC:  NONE   Co-morbidities impacting POC:  NONE  Complexity of physical exam: INCLUDING MUSCULOSKELETAL SYSTEM (POSTURE, ROM, STRENGTH, HEIGHT/WEIGHT) and INCLUDING ACTIVITY/MOBILITY RESTRICTIONS   Clinical Presentation: STABLE   Evaluation Complexity: LOW-HISTORY 0, EXAMINATION 1-2, STABLE PRESENTATION        Total Session Time 30 and Timed code minutes 0        Intervention minutes: EVALUATION 30 minutes    Leanor Rubenstein, PT   04/23/2022, 08:46        Start of Service: _________          Certification:    From:______  Through:_________    I certify the need for these services furnished under this plan of treatment and while under my care.    Referring Provider Signature: _______________     Date : _____________________

## 2022-04-24 ENCOUNTER — Other Ambulatory Visit (HOSPITAL_COMMUNITY): Payer: Self-pay | Admitting: FAMILY PRACTICE

## 2022-04-24 DIAGNOSIS — M5090 Cervical disc disorder, unspecified, unspecified cervical region: Secondary | ICD-10-CM

## 2022-04-29 ENCOUNTER — Ambulatory Visit (HOSPITAL_COMMUNITY)
Admission: RE | Admit: 2022-04-29 | Discharge: 2022-04-29 | Disposition: A | Discharge status: Still In Hospital | Payer: Commercial Managed Care - PPO | Source: Ambulatory Visit | Attending: FAMILY PRACTICE | Admitting: FAMILY PRACTICE

## 2022-04-29 ENCOUNTER — Other Ambulatory Visit: Payer: Self-pay

## 2022-04-29 NOTE — PT Treatment (Signed)
[facility]  Outpatient Physical Therapy  [place], [address]  [phone]  [phone]    Physical Therapy Treatment Note    Date: 04/29/2022  Patient's Name: Mitchell Burton  Date of Birth: January 03, 1946            Visit #/POC: 2/ 8  Authorization:  8  POC Signed?: no  POC Ends:  05/28/22  Next Progress Note Due:  05/24/22      Evaluating Physical Therapist:  Leanor Rubenstein PT  PT diagnosis/Reason for Referral: Cervicalgia  Next Scheduled Physician Appointment:  05/06/22  Allergies/Contraindications: na          Subjective: Neck feeling stiff with difficulty turning.  Notes he is trying to be active but notes he does have some limited mobility.      Objective:  MFR and joint mobs as noted below      EXERCISE/ACTIVITY NAME REPETITIONS RESISTANCE COMPLETED THIS DOS   Suboccipital release   na manual yes   P/A glides   na gentle yes   Side glides   na gentle yes   Manual traction   na manual yes   Subcranial mobility over small roll    Flexion/extension na yes                                         Assessment: Pt tolerated well and notes some relief after session.  He does have complain of light headed feeling with transitions, laying completely flat, or full cervical extension.      Plan: Will monitor effects of manual treatment and proceed accordingly    Short Term Goals: 2 Weeks  -Independent home program. ROM / Stabilization / Self care / Activity modification.               -Knowledge of proper body mechanics.               -Decrease in pain. Intermittent vs. constant. Worst SPS rating less than 10.               -Increase ROM / Strength  -Improve function. Decreased sleep disruption / Reduce HAS                  Long Term Goals: 3-4 Weeks               -Abolish radicular symptoms. Worst SPS rating less than 3.  -Increase AROM  to [facility] to allow normal body mechanics with all mobility.  -Improve function.  Abolish HAS      Total Session Time 28 and Timed code  minutes 28  JOINT MOBILIZATION/MFR 28 minutes      Loreta Blouch, PTA  04/29/2022, 16:58

## 2022-05-01 ENCOUNTER — Ambulatory Visit (HOSPITAL_COMMUNITY)
Admission: RE | Admit: 2022-05-01 | Discharge: 2022-05-01 | Disposition: A | Discharge status: Still In Hospital | Payer: Commercial Managed Care - PPO | Source: Ambulatory Visit | Attending: FAMILY PRACTICE | Admitting: FAMILY PRACTICE

## 2022-05-01 ENCOUNTER — Other Ambulatory Visit: Payer: Self-pay

## 2022-05-01 NOTE — PT Treatment (Addendum)
[facility]  Outpatient Physical Therapy  [place], [address]  [phone]  [phone]    Physical Therapy Treatment Note    Date: 05/01/2022  Patient's Name: Mitchell Burton  Date of Birth: 1946-01-31                  THERAPEUTIC EXERCISE 15 minutes and JOINT MOBILIZATION/MFR 32 minutes  Total Session Time 50 and Timed code minutes [place], PT  05/01/2022, 16:10[facility]  Outpatient Physical Therapy  [place], [address]  [phone]  [phone]    Physical Therapy Treatment Note    Date: 04/29/2022  Patient's Name: Mitchell Burton  Date of Birth: December 26, 1945      Visit #/POC: 3/ 8  Authorization:  8  POC Signed?: no  POC Ends:  05/28/22  Next Progress Note Due:  05/24/22      Evaluating Physical Therapist:  Leanor Rubenstein PT  PT diagnosis/Reason for Referral: Cervicalgia  Next Scheduled Physician Appointment:  05/06/22  Allergies/Contraindications: na          Subjective: he notes neck tightness  and pulling on left side with movement of his neck. His pain is intermittent.  He has min soreness that last about 24 hours. He has occasional dizziness that he describes as vertigo. Per pt he had a balance test with the audiologist who told the pt his exam was normal.  Objective:  MFR and joint mobs as noted below      EXERCISE/ACTIVITY NAME REPETITIONS RESISTANCE COMPLETED THIS DOS   Suboccipital release   na manual yes   P/A glides   na gentle yes   Side glides   na gentle Yes, BILAT   Manual traction   na manual yes   Subcranial mobility over small roll    Flexion/extension na NO   Seated 3 position scalene stretch using a stabilization belt.    Left scalenes  No overpressure Yes, added to HEP 8/10   Supine subcranial rotation isometrics in neutral with facilitory eye gaze   R/l alternating 8 sec hold Manual stabilization of occiput Yes, added to hep 8/10   Multidirectional subcranial  isometrics to improve C0/C1, C1/2  facet mobility   SB L, ROT R  SB R, ROT L Manual resistance YES             Access Code: [email or web address]  URL: [email or web address]  Date: 05/01/2022  Prepared by: Leanor Rubenstein    Exercises  - Supine Isometric Neck Rotation  - 2 x daily - 7 x weekly - 2 sets - 10 reps - 8 hold  Access Code: [email or web address]  URL: [email or web address]  Date: 05/01/2022  Prepared by: Leanor Rubenstein    Exercises  - Seated Scalene Stretch with Towel  - 1 x daily - 7 x weekly - 1 sets - 3 reps - 30s hold  Assessment: The pt has subcranial joint hypomobility, with decrease SBL, ROTR, FLEXION. His passive joint play was improved post session. He has a negative vertebral artery test bilat. No nystagmus was noted during session.     Short Term Goals: 2 Weeks  -Independent home program. ROM / Stabilization / Self care / Activity modification.               -Knowledge of proper body mechanics.               -Decrease in  pain. Intermittent vs. constant. Worst SPS rating less than 10.               -Increase ROM / Strength  -Improve function. Decreased sleep disruption / Reduce HAS                  Long Term Goals: 3-4 Weeks               -Abolish radicular symptoms. Worst SPS rating less than 3.  -Increase AROM  to [facility] to allow normal body mechanics with all mobility.  -Improve function.  Abolish HAS  Plan: add multiplane subcranial isometrics next session. Review seated 3 position scalene stretch.       Total Session Time 50 and Timed code minutes 50  THERAPEUTIC EXERCISE 15 minutes and JOINT MOBILIZATION/MFR 32 minutes  Leanor Rubenstein, PT

## 2022-05-05 ENCOUNTER — Other Ambulatory Visit: Payer: Self-pay

## 2022-05-05 ENCOUNTER — Ambulatory Visit (HOSPITAL_COMMUNITY)
Admission: RE | Admit: 2022-05-05 | Discharge: 2022-05-05 | Disposition: A | Discharge status: Still In Hospital | Payer: Commercial Managed Care - PPO | Source: Ambulatory Visit | Attending: FAMILY PRACTICE | Admitting: FAMILY PRACTICE

## 2022-05-05 NOTE — PT Treatment (Addendum)
[facility]  Outpatient Physical Therapy  [place], [address]  [phone]  [phone]    Physical Therapy Treatment Note    Date: 04/29/2022  Patient's Name: Mitchell Burton  Date of Birth: [birth year]      Visit #/POC: 4/ 8  Authorization:  8  POC Signed?: no  POC Ends:  05/28/22  Next Progress Note Due:  05/24/22      Evaluating Physical Therapist:  Leanor Rubenstein PT  PT diagnosis/Reason for Referral: Cervicalgia  Next Scheduled Physician Appointment:  05/06/22  Allergies/Contraindications: na          Subjective: Patient reports he is ok today. He is having some pain mostly on left side of neck. He describes the pain as "not bad but comes and goes if I turn my neck". He has had dizziness over the weekend but states it is not as bad as it was. Patient had some increased soreness/pain after last session up to 8/10. This increased pain lasted about 24 hours and resolved. He has been doing scalene stretch with belt at home as well as subcranial isometrics.    Objective:  MFR and joint mobs as noted below      EXERCISE/ACTIVITY NAME REPETITIONS RESISTANCE COMPLETED THIS DOS   Suboccipital release   na manual yes   P/A glides   na gentle yes   Side glides   na gentle Yes, BILAT   Manual traction   na manual yes   Subcranial mobility over small roll    Flexion/extension na NO   Seated 3 position scalene stretch using a stabilization belt.    Left scalenes  No overpressure Yes, added to HEP 8/10  Reviewed 8/14   Supine subcranial rotation isometrics in neutral with facilitory eye gaze   R/l alternating 8 sec hold Manual stabilization of occiput Yes, added to hep 8/10  Reviewed 8/14   Multidirectional subcranial isometrics to improve C0/C1, C1/2  facet mobility   SB L, ROT R  SB R, ROT L Manual resistance YES             BP checked in supine 125/73 HR 46  Transition to seated and rechecked 133/80 HR 49      Assessment: Patient had report of dizziness with transition  from sit to supine. Blood pressure was assessed in supine and then seated position. HR was noted to be low but patient states "it's always ran in the 40's my whole life. My doctor knows about it". Patient tolerated session well with no report of neurological symptoms during manual.     Short Term Goals: 2 Weeks  -Independent home program. ROM / Stabilization / Self care / Activity modification.               -Knowledge of proper body mechanics.               -Decrease in pain. Intermittent vs. constant. Worst SPS rating less than 10.               -Increase ROM / Strength  -Improve function. Decreased sleep disruption / Reduce HAS                  Long Term Goals: 3-4 Weeks               -Abolish radicular symptoms. Worst SPS rating less than 3.  -Increase AROM  to [facility] to allow normal body mechanics with all mobility.  -  Improve function.  Abolish HAS  Plan: add multiplane subcranial isometrics next session.       Total Session Time 40 and Timed code minutes 40  THERAPEUTIC EXERCISE 15 minutes and JOINT MOBILIZATION/MFR 25 minutes      [employer], PTA  05/05/2022 08:49

## 2022-05-07 ENCOUNTER — Other Ambulatory Visit: Payer: Self-pay

## 2022-05-07 ENCOUNTER — Ambulatory Visit (HOSPITAL_COMMUNITY)
Admission: RE | Admit: 2022-05-07 | Discharge: 2022-05-07 | Disposition: A | Discharge status: Still In Hospital | Payer: Commercial Managed Care - PPO | Source: Ambulatory Visit | Attending: FAMILY PRACTICE | Admitting: FAMILY PRACTICE

## 2022-05-07 NOTE — Progress Notes (Signed)
[facility]  Outpatient Physical Therapy  [place], [address]  [phone]  [phone]    Physical Therapy Treatment Note    Date: 05/07/2022  Patient's Name: Mitchell Burton  Date of Birth: [birth year]      Visit #/POC: 5/ 8  Authorization:  5 /8  POC Signed?: no  POC Ends:  05/28/22  Next Progress Note Due:  05/24/22        Evaluating Physical Therapist:  Leanor Rubenstein PT  PT diagnosis/Reason for Referral: Cervicalgia  Next Scheduled Physician Appointment:  05/06/22  Allergies/Contraindications: na              Subjective: his neck is better . He can turn his head better, SPS = 2 with movement   Objective:   cervical AROM: 60  degrees rotation bilat, 20 degrees SB right, 15 degrees SB left, full flexion, full extension ( slight deviation r at end range)        EXERCISE/ACTIVITY NAME REPETITIONS RESISTANCE COMPLETED THIS DOS   Suboccipital release    na manual yes   P/A glides    na gentle yes   Side glides    na gentle no, BILAT   Manual traction    na manual yes   Subcranial mobility over small roll     Flexion/extension na NO   Seated 3 position scalene stretch using a stabilization belt.     Left scalenes  No overpressure no, added to HEP 8/10  Reviewed 8/14   Supine subcranial rotation isometrics in neutral with facilitory eye gaze    R/l alternating 8 sec hold Manual stabilization of occiput Yes, added to hep 8/10  Reviewed 8/14   Multidirectional subcranial isometrics to improve C0/C1, C1/2  facet mobility    SB L, ROT R  SB R, ROT L Manual resistance YES    supine thoracic mobility with wand, upper and lower thoracic spine ROM with bilat and unilat shoulder flexion     x 10 each  using a towel roll under T9 for fulcurm  yes, added to hep           Assessment: the pt is no longer acutely painful. He has tight vs painful end feel with active motions of his neck. He has a rigid upper thoracic spine and poor segmental recruitment noted with active head  rotation.   Short Term Goals: 2 Weeks  -Independent home program. ROM / Stabilization / Self care / Activity modification. ( Met 05/07/22)               -Knowledge of proper body mechanics.               -Decrease in pain. Intermittent vs. constant. Worst SPS rating less than 10. ( Met 05/07/22)               -Increase ROM / Strength ( met 05/07/22)  -Improve function. Decreased sleep disruption / Reduce HAS ( met 05/07/22)                 Long Term Goals: 3-4 Weeks               -Abolish radicular symptoms. Worst SPS rating less than 3.  -Increase AROM  to [facility] to allow normal body mechanics with all mobility.  -Improve function.  Abolish HAS    Plan: continue with upper thoracic mobility exercise active and passive    Total Session Time 42  THERAPEUTIC EXERCISE 15 minutes and JOINT MOBILIZATION/MFR 28 minutes      Leanor Rubenstein, PT  05/07/2022, 08:50

## 2022-05-12 ENCOUNTER — Other Ambulatory Visit: Payer: Self-pay

## 2022-05-12 ENCOUNTER — Ambulatory Visit (HOSPITAL_COMMUNITY)
Admission: RE | Admit: 2022-05-12 | Discharge: 2022-05-12 | Disposition: A | Discharge status: Still In Hospital | Payer: Commercial Managed Care - PPO | Source: Ambulatory Visit | Attending: FAMILY PRACTICE | Admitting: FAMILY PRACTICE

## 2022-05-12 NOTE — Progress Notes (Signed)
[facility]  Outpatient Physical Therapy  [place], [address]  [phone]  [phone]    Physical Therapy Treatment Note    Date: 05/12/2022  Patient's Name: Mitchell Burton  Date of Birth: [birth year]    Visit #/POC: 6 / 8  Authorization:  6 /8  POC Signed?: no  POC Ends:  05/28/22  Next Progress Note Due:  05/24/22        Evaluating Physical Therapist:  Leanor Rubenstein PT  PT diagnosis/Reason for Referral: Cervicalgia  Next Scheduled Physician Appointment: tba  Allergies/Contraindications: na       Subjective: his neck was sore for about 24 hours after starting the new exercises. SPS = 1 with right rotation ( a tightness vs pain).  His PCP  has ordered an MRI of his head due to pts c/o dizziness ( which is better).    Objective:   cervical AROM: 60  degrees rotation left, 55 degrees rotation right;     EXERCISE/ACTIVITY NAME REPETITIONS RESISTANCE COMPLETED THIS DOS   Suboccipital release    na manual yes   P/A glides    na gentle Yes, upper thoracic in supine   Side glides    na gentle yes, BILAT   Manual traction    na manual no   Subcranial mobility over small roll     Flexion/extension na NO   Seated 3 position scalene stretch using a stabilization belt.     Left scalenes  No overpressure no, added to HEP 8/10  Reviewed 8/14   Supine subcranial rotation isometrics in neutral with facilitory eye gaze    R/l alternating 8 sec hold Manual stabilization of occiput no, added to hep 8/10  Reviewed 8/14   Multidirectional subcranial isometrics to improve C0/C1, C1/2  facet mobility    SB L, ROT R  SB R, ROT L Manual resistance no    supine thoracic mobility with wand, upper and lower thoracic spine ROM with bilat and unilat shoulder flexion     x 10 each  using a towel roll under T9 for fulcurm  no, added to hep   Seated and supine upper thoracic mobilization na Manual, segmental rotation and extension yes   Thoracic mobility in supine with mediball: triplanar  UE circumduction cw/ccw; UE flexion/extension rom 4 lb Yes, added to HEP 8/21   Standing cervical stabilization with trunk rotation, using mirror 4 lb mediball hand to hand toss and body circumduction 4 lb Yes, added to HEP 8/21      Access Code: [email or web address]  URL: [email or web address]  Date: 05/12/2022  Prepared by: Leanor Rubenstein    Exercises  - Supine Shoulder Overhead Flexion with Medicine Ball  - 1 x daily - 7 x weekly - 3 sets - 10 reps     Assessment: the pt is no longer acutely painful. He has tight vs painful end feel with active motions of his neck. He has a rigid upper thoracic spine and poor segmental recruitment noted with active head rotation. He has noted  further improvement with addition of thoracic mobility exercises. He needed light manual assist to maintain neutral cervical spine orientation during active trunk rotation exercise  Short Term Goals: 2 Weeks  -Independent home program. ROM / Stabilization / Self care / Activity modification. ( Met 05/07/22)               -Knowledge of proper body mechanics.               -  Decrease in pain. Intermittent vs. constant. Worst SPS rating less than 10. ( Met 05/07/22)               -Increase ROM / Strength ( met 05/07/22)  -Improve function. Decreased sleep disruption / Reduce HAS ( met 05/07/22)                 Long Term Goals: 3-4 Weeks               -Abolish radicular symptoms. Worst SPS rating less than 3. ( Met 05/12/22)  -Increase AROM  to [facility] to allow normal body mechanics with all mobility. ( Progressing)  -Improve function.  Abolish HAS ( met 05/12/22)      Plan: 2 more sessions per POC. Focus on improving thoracic and scapular movement.     Total Session Time 42 and Timed code minutes 42  THERAPEUTIC EXERCISE 15 minutes and JOINT MOBILIZATION/MFR 30 minutes      Leanor Rubenstein, PT  05/12/2022, [phone]

## 2022-05-14 ENCOUNTER — Other Ambulatory Visit: Payer: Self-pay

## 2022-05-14 ENCOUNTER — Ambulatory Visit (HOSPITAL_COMMUNITY)
Admission: RE | Admit: 2022-05-14 | Discharge: 2022-05-14 | Disposition: A | Discharge status: Still In Hospital | Payer: Commercial Managed Care - PPO | Source: Ambulatory Visit | Attending: FAMILY PRACTICE | Admitting: FAMILY PRACTICE

## 2022-05-14 NOTE — PT Treatment (Signed)
[facility]  Outpatient Physical Therapy  [place], [address]  [phone]  [phone]     Physical Therapy Treatment Note     Date: 05/12/2022  Patient's Name: Mitchell Burton  Date of Birth: 1945/11/16     Visit #/POC: 7 / 8  Authorization:  7 /8  POC Signed?: no  POC Ends:  05/28/22  Next Progress Note Due:  05/24/22        Evaluating Physical Therapist:  Leanor Rubenstein PT  PT diagnosis/Reason for Referral: Cervicalgia  Next Scheduled Physician Appointment: tba  Allergies/Contraindications: na        Subjective: patient was a little "stiff" through mid back after last session. No report of pain.      Objective:     EXERCISE/ACTIVITY NAME REPETITIONS RESISTANCE COMPLETED THIS DOS   Suboccipital release    na manual yes   P/A glides    na gentle Yes, thoracic in prone   Side glides    na gentle yes, BILAT   Manual traction    na manual no   Subcranial mobility over small roll     Flexion/extension na NO   Seated 3 position scalene stretch using a stabilization belt.     Left scalenes  No overpressure no, added to HEP 8/10  Reviewed 8/14   Supine subcranial rotation isometrics in neutral with facilitory eye gaze    R/l alternating 8 sec hold Manual stabilization of occiput no, added to hep 8/10  Reviewed 8/14   Multidirectional subcranial isometrics to improve C0/C1, C1/2  facet mobility    SB L, ROT R  SB R, ROT L Manual resistance no    supine thoracic mobility with wand, upper and lower thoracic spine ROM with bilat and unilat shoulder flexion     x 10 each  using a towel roll under T9 for fulcurm  no, added to hep   Seated and supine upper thoracic mobilization na Manual, segmental rotation and extension yes   Thoracic mobility in supine with mediball: triplanar UE circumduction cw/ccw; UE flexion/extension rom 4 lb Yes, added to HEP 8/21   Standing cervical stabilization with trunk rotation, using mirror 4 lb mediball hand to hand toss and body  circumduction 4 lb Yes, added to HEP 8/21   Seated trunk rotation with swiss ball 10 reps each side Tactile cues for thoracic mobility Yes               Assessment: Patient was very stiff through thoracic spine at beginning of treatment. This improved with exercise and after PA mobilization. Patient needed tactile cues for isolating thoracic spine with rotational movement.     Short Term Goals: 2 Weeks  -Independent home program. ROM / Stabilization / Self care / Activity modification. ( Met 05/07/22)               -Knowledge of proper body mechanics.               -Decrease in pain. Intermittent vs. constant. Worst SPS rating less than 10. ( Met 05/07/22)               -Increase ROM / Strength ( met 05/07/22)  -Improve function. Decreased sleep disruption / Reduce HAS ( met 05/07/22)                 Long Term Goals: 3-4 Weeks               -  Abolish radicular symptoms. Worst SPS rating less than 3. ( Met 05/12/22)  -Increase AROM  to [facility] to allow normal body mechanics with all mobility. ( Progressing)  -Improve function.  Abolish HAS ( met 05/12/22)        Plan: One more visit this POC.      Total Session Time 40 and Timed code minutes 40  THERAPEUTIC EXERCISE 15 minutes and JOINT MOBILIZATION/MFR 25 minutes        [employer], PTA  05/14/2022 09:03

## 2022-05-22 ENCOUNTER — Ambulatory Visit
Admission: RE | Admit: 2022-05-22 | Discharge: 2022-05-22 | Disposition: A | Discharge status: Still In Hospital | Payer: Commercial Managed Care - PPO | Source: Ambulatory Visit | Attending: FAMILY PRACTICE | Admitting: FAMILY PRACTICE

## 2022-05-22 ENCOUNTER — Other Ambulatory Visit: Payer: Self-pay

## 2022-05-22 NOTE — Progress Notes (Signed)
[facility]  Outpatient Physical Therapy  [place], [address]  [phone]  [phone]    Physical Therapy Treatment Note    Date: 05/22/2022  Patient's Name: Mitchell Burton  Date of Birth: 1946-05-24        Visit #/POC: 8/ 8  Authorization:  8 /8  POC Signed?: no  POC Ends:  05/28/22  Next Progress Note Due:  05/24/22        Evaluating Physical Therapist:  Leanor Rubenstein PT  PT diagnosis/Reason for Referral: Cervicalgia  Next Scheduled Physician Appointment: tba  Allergies/Contraindications: na        Subjective: pt denies neck pain today, he has had several days of no neck pain. His last neck pain was related to looking up  and was a fleeting discomfort, alleviated immediately by moving out of extended position. Lynne Logan SPS rating = 1, associated with looking up.      AROM:  55 degrees rotation bilat with normal end feel;  20 degrees sb left, 15 degrees sb right; full flexion with no deviation and normal end feel; 50 degrees extension with no deviation.   EXERCISE/ACTIVITY NAME REPETITIONS RESISTANCE COMPLETED THIS DOS   Suboccipital release    na manual no   P/A glides    na gentle no    Side glides    na gentle no   Manual traction    na manual no   Subcranial mobility over small roll     Flexion/extension na NO   Seated 3 position scalene stretch using a stabilization belt.     Left scalenes  No overpressure no, added to HEP 8/10  Reviewed 8/14   Supine subcranial rotation isometrics in neutral with facilitory eye gaze    R/l alternating 8 sec hold Manual stabilization of occiput no, added to hep 8/10  Reviewed 8/14   Multidirectional subcranial isometrics to improve C0/C1, C1/2  facet mobility    SB L, ROT R  SB R, ROT L Manual resistance no    supine thoracic mobility with wand, upper and lower thoracic spine ROM with bilat and unilat shoulder flexion     x 10 each  using a towel roll under T9 for fulcurm  yes, performed and reviewed, modified for  stronger stretch , added to hep    Rotator cuff stretch in supine with dowel, at 90/90 shoulder/elbow position   Yes, added to hep 8/31   Seated and supine upper thoracic mobilization na Manual, segmental rotation and extension no   Thoracic mobility in supine with mediball: triplanar UE circumduction cw/ccw; UE flexion/extension rom 4 lb no, added to HEP 8/21   Standing cervical stabilization with trunk rotation, using mirror 4 lb mediball hand to hand toss and body circumduction 4 lb no, added to HEP 8/21   Seated trunk rotation with swiss ball 10 reps each side Tactile cues for thoracic mobility no    supine anterior chest stretch   (Yoga restorative pose)    yes, added to HEP 8 / 31        Assessment:  the pt has met all functional goals of therapy and has a thorough HEP.   Short Term Goals: 2 Weeks  -Independent home program. ROM / Stabilization / Self care / Activity modification. ( Met 05/07/22)               -Knowledge of proper body mechanics. ( Met 8/31)               -  Decrease in pain. Intermittent vs. constant. Worst SPS rating less than 10. ( Met 05/07/22)               -Increase ROM / Strength ( met 05/07/22)  -Improve function. Decreased sleep disruption / Reduce HAS ( met 05/07/22)                 Long Term Goals: 3-4 Weeks               -Abolish radicular symptoms. Worst SPS rating less than 3. ( Met 05/12/22)  -Increase AROM  to [facility] to allow normal body mechanics with all mobility. ( met 8/31)  -Improve function.  Abolish HAS ( met 05/12/22)    Plan: discharge with HEP. Pt is aggreeable with discharge with HEP at this time.     Total Session Time 22 and Timed code minutes 22  THERAPEUTIC EXERCISE 22 minutes      Leanor Rubenstein, PT  05/22/2022, 1000

## 2022-06-04 IMAGING — MR MRA HEAD WITH AND WITHOUT CONTRAST
4 of 6 series · 10 of 48 positions shown · IV contrast (gadavist)
Comparison: None previous available.

﻿EXAM:  [id number]   MRA HEAD WITH AND WITHOUT CONTRAST
INDICATION: 76-year-old male with history of dizziness.  Recurring dizzy spells.  Cardiovascular risk factors not given.
TECHNIQUE: After axial, sagittal images including diffusion sequence before contrast, pre and postcontrast MR angiogram of the head was performed before and after administration of 8 mL Gadavist IV.  MIP reconstruction images were obtained in 3D.

[Series 6: DWI · axial · 5.0mm · 1.35mm/px · z∈[-72,+66]mm · 2 of 24 slices shown (1 of 2)]
[im 1/24]
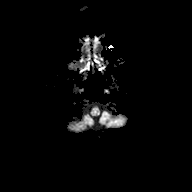
[im 24/24]
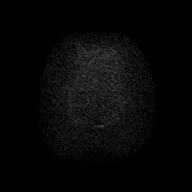

[Series 7: DWI · axial · 5.0mm · 1.35mm/px · z∈[-72,+66]mm · 2 of 24 slices shown (2 of 2)]
[im 1/24]
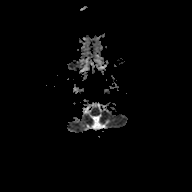
[im 24/24]
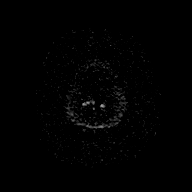

[Series 8: FLAIR · sagittal · 4.2mm · 0.75mm/px · 3 of 28 slices shown]
[im 1/28]
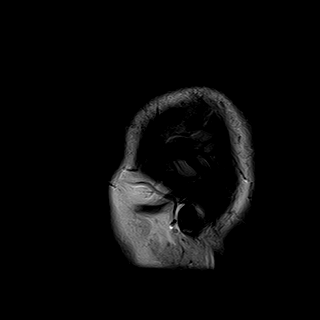
[im 14/28]
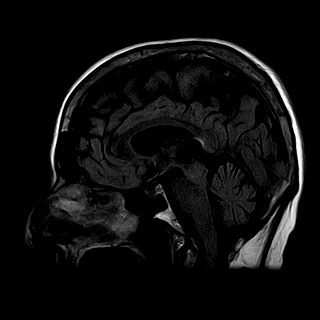
[im 28/28]
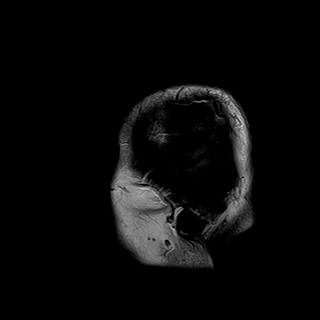

[Series 9: T2 · axial · 5.0mm · 0.43mm/px · z∈[-75,+69]mm · 3 of 25 slices shown]
[im 1/25]
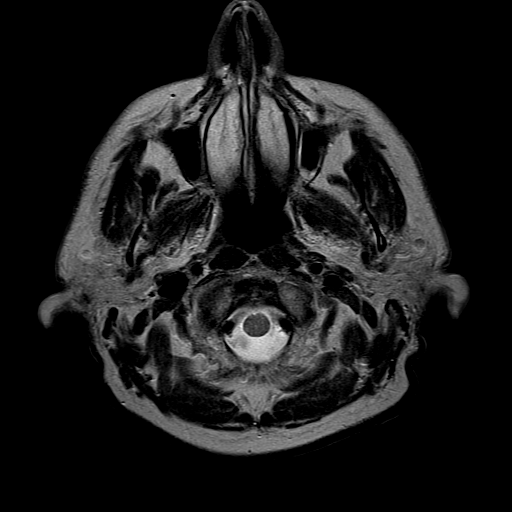
[im 13/25]
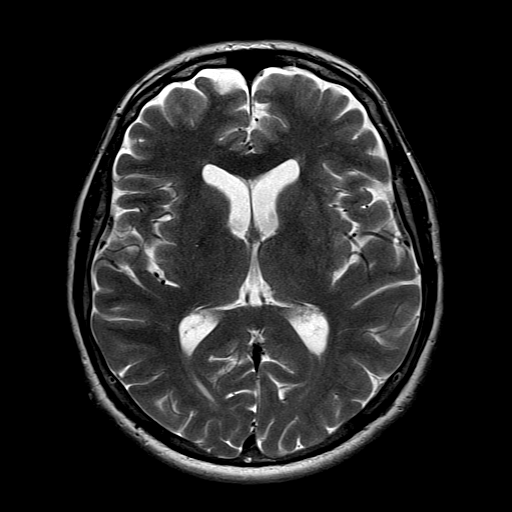
[im 25/25]
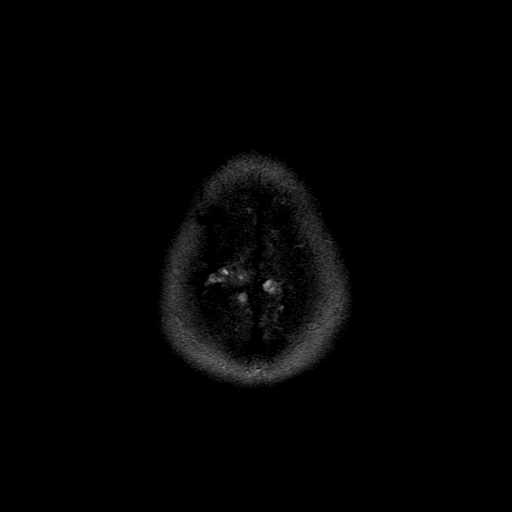

[10 of 48 positions shown; findings below may reference images not displayed]

FINDINGS: No focal areas of restricted diffusion on diffusion sequence.  No space-occupying lesions, intra-axial mass or ventriculomegaly are seen.  Age-appropriate, symmetric cerebral cortical atrophy is noted.  Minimal chronic small-vessel ischemic change on the sagittal FLAIR images are noted.

On the angiographic images, the vertebral basilar arteries are patent.  The posterior cerebral arteries on both sides arise from the basilar artery.

Intracranial portion of internal carotid arteries are patent on both sides.  The anterior cerebral, middle cerebral arteries are patent on both sides.  The dural venous sinuses are patent.
IMPRESSION: 1. No acute ischemic change. Minimal chronic small-vessel ischemia on FLAIR sequence.

2. No intracranial space-occupying lesions or ventriculomegaly are seen.  Age-appropriate, symmetric cerebral atrophy.

3. No obstructive lesions of the major arteries of circle of Willis. Dural venous sinuses are patent.

## 2022-06-04 IMAGING — US CAROTID US W/DOPPLER
1 series · 14 of 24 positions shown · non-contrast
Comparison: MR angiogram of intracranial circulation dated 06/04/2022.

﻿EXAM:  CAROTID US W/DOPPLER
INDICATION: 76-year-old with vertigo and dizziness.  Nonsmoker.

[Series 1: carotid us w/doppler · 14 of 71 slices shown]
[im 1/71]
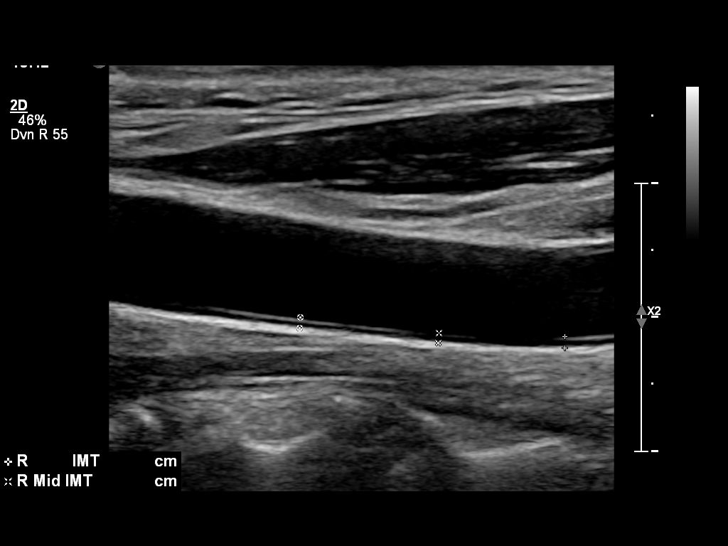
[im 7/71]
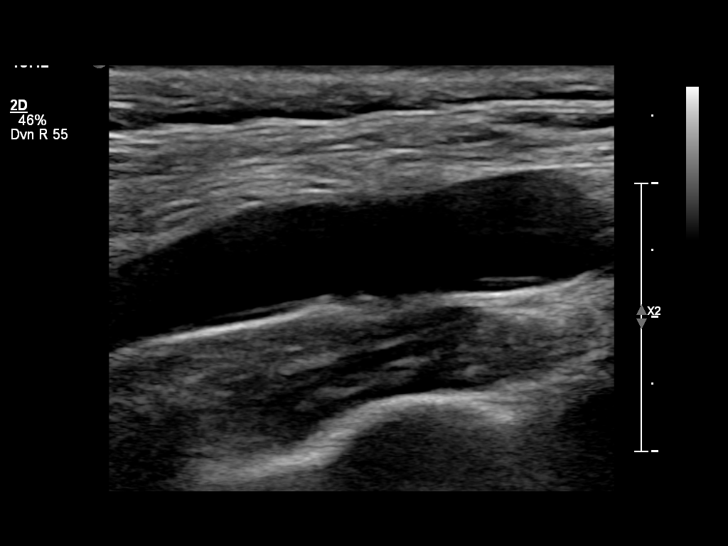
[im 13/71]
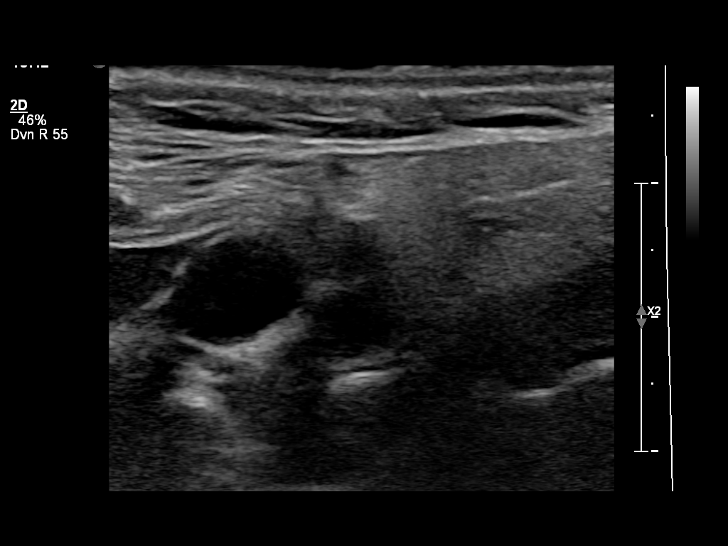
[im 19/71]
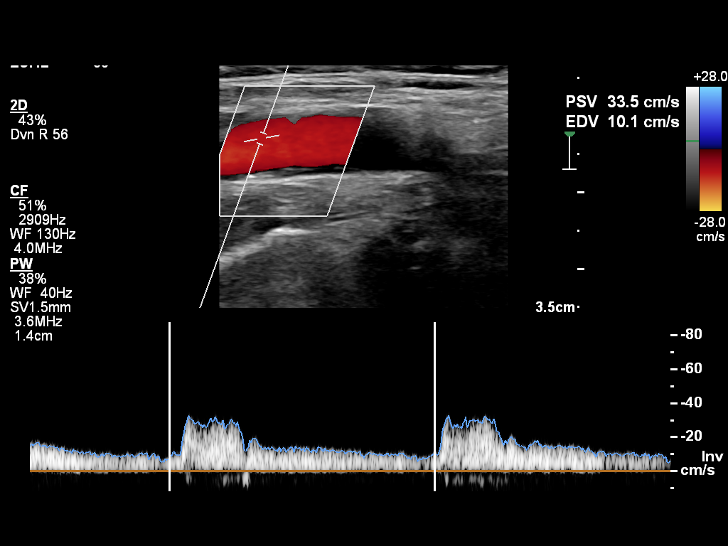
[im 22/71]
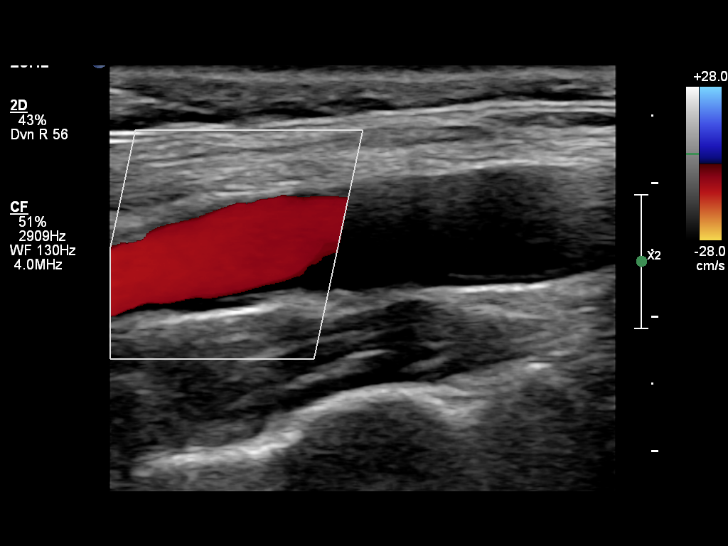
[im 28/71]
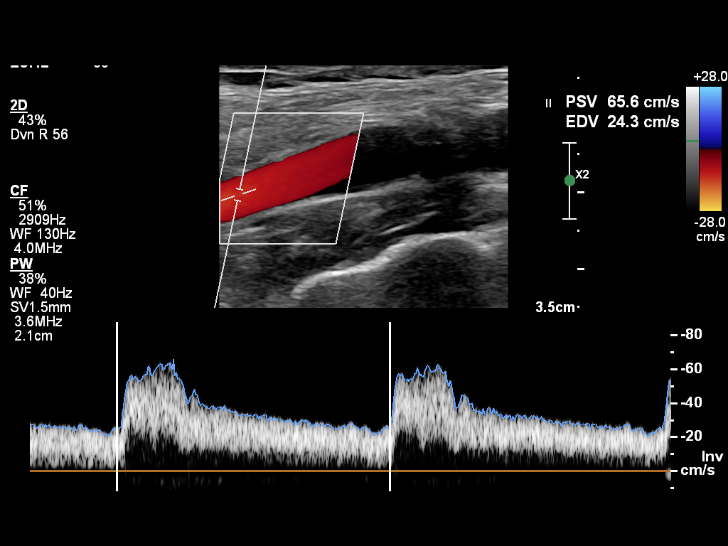
[im 34/71]
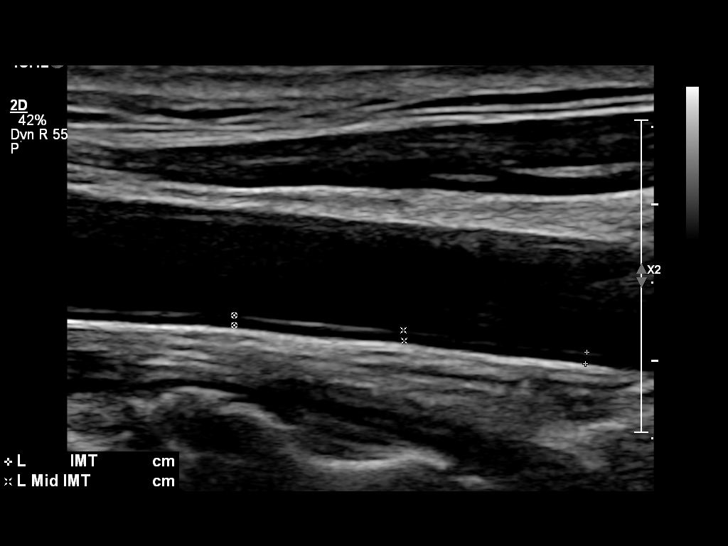
[im 37/71]
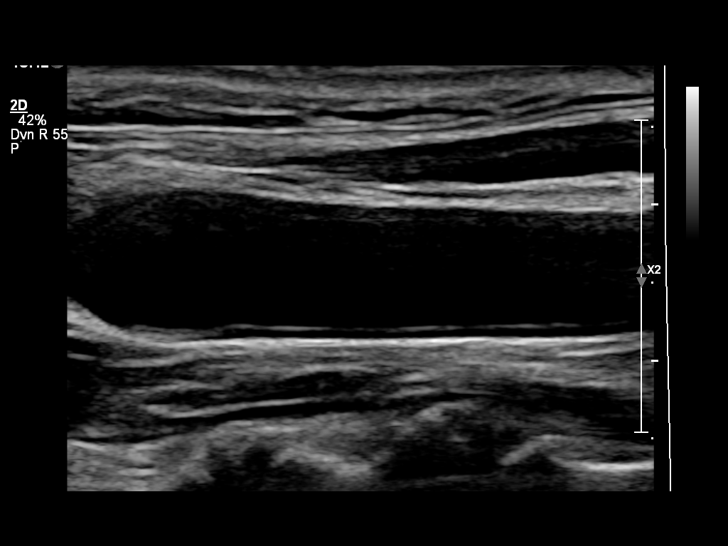
[im 43/71]
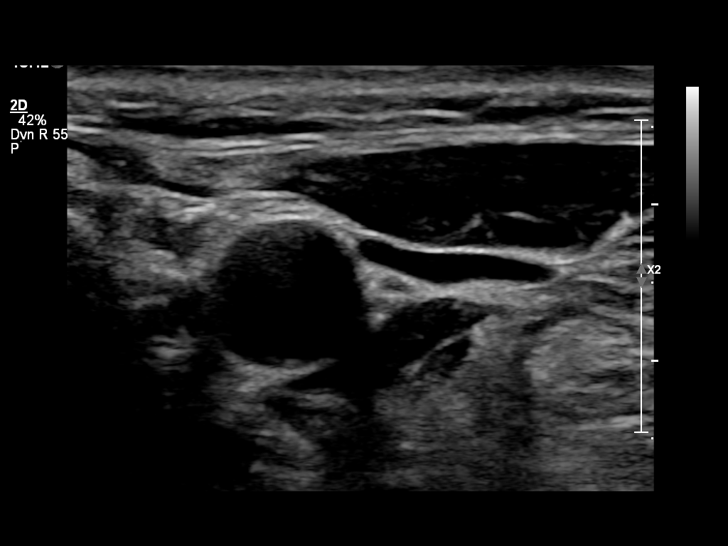
[im 49/71]
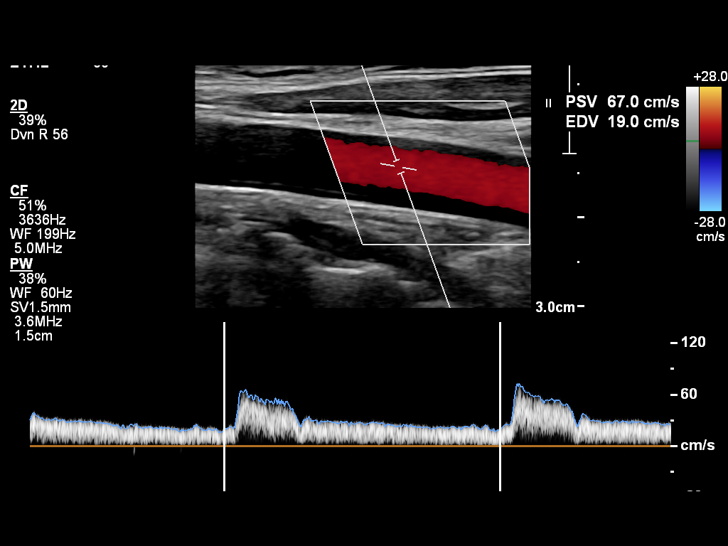
[im 55/71]
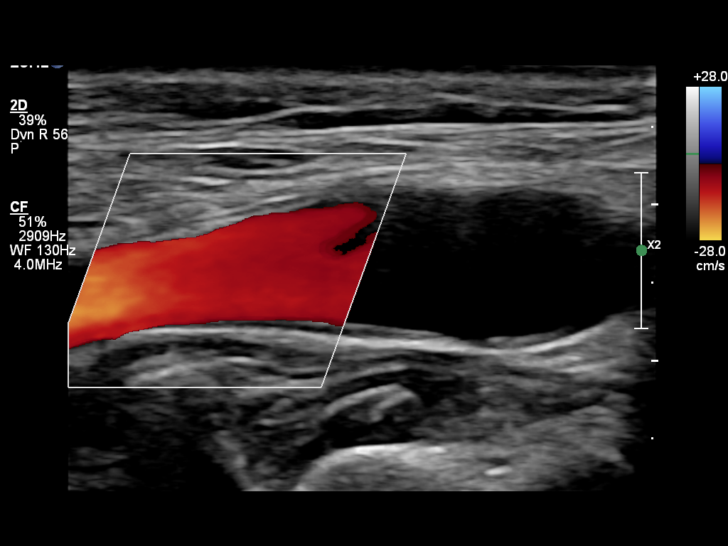
[im 58/71]
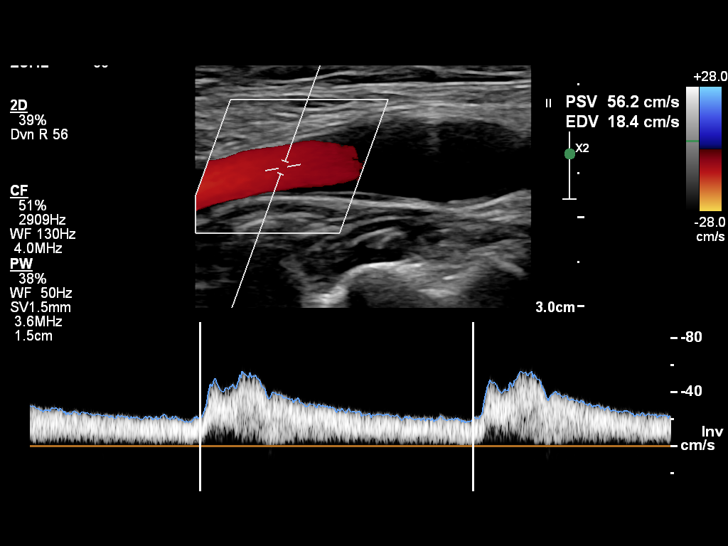
[im 64/71]
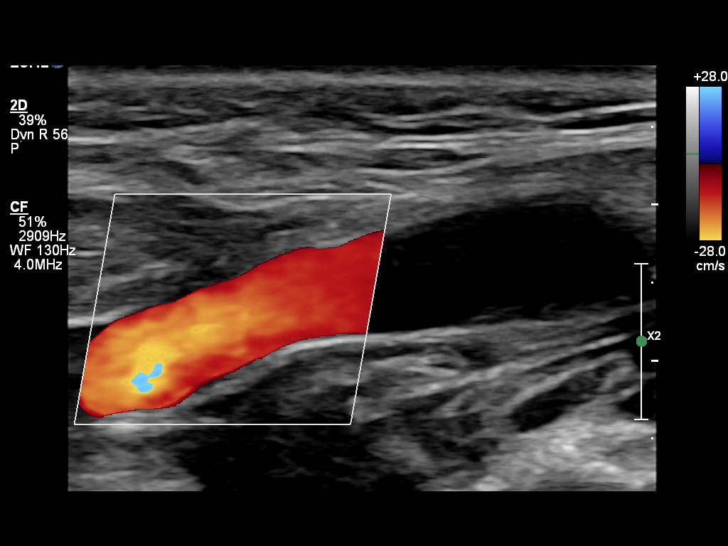
[im 71/71]
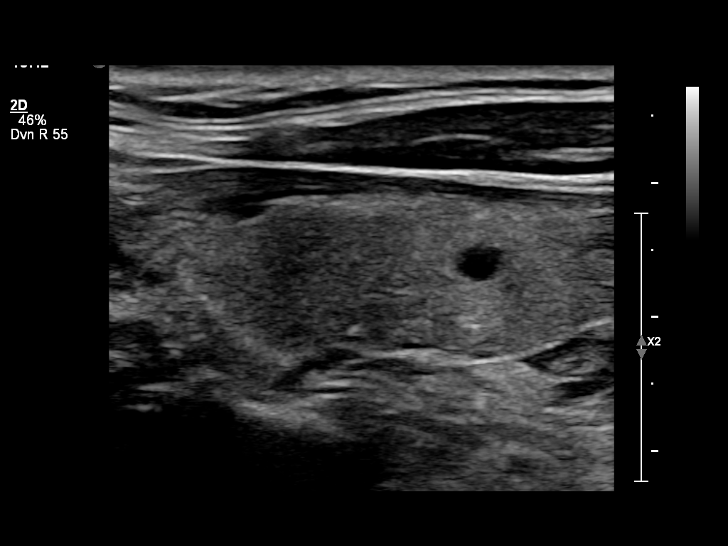

[14 of 24 positions shown; findings below may reference images not displayed]

FINDINGS: Right CCA

85

Left CCA

75

PSV

IC/CC

EDV

Right Internal

66

0.77

26

Left Internal

87

1.15

33

Right Vertebral

Antegrade

Left Vertebral

Antegrade

There is no significant atherosclerotic plaque within the carotid bulbs and internal carotid arteries. No hemodynamically significant stenosis or abnormal elevation of velocity is seen at any level. Both vertebral arteries are patent with appropriate antegrade direction of flow.
IMPRESSION: No hemodynamically significant stenosis within the internal carotid arteries as per NASCET criteria.

## 2022-07-17 ENCOUNTER — Encounter (INDEPENDENT_AMBULATORY_CARE_PROVIDER_SITE_OTHER): Payer: Self-pay | Admitting: Surgery

## 2022-07-22 ENCOUNTER — Encounter (INDEPENDENT_AMBULATORY_CARE_PROVIDER_SITE_OTHER): Payer: Self-pay | Admitting: Surgery

## 2022-07-22 ENCOUNTER — Ambulatory Visit (INDEPENDENT_AMBULATORY_CARE_PROVIDER_SITE_OTHER): Payer: Commercial Managed Care - PPO | Admitting: Surgery

## 2022-07-22 ENCOUNTER — Other Ambulatory Visit: Payer: Self-pay

## 2022-07-22 VITALS — BP 139/71 | HR 60 | Temp 97.8°F | Resp 18 | Ht 67.0 in | Wt 191.0 lb

## 2022-07-22 DIAGNOSIS — Z872 Personal history of diseases of the skin and subcutaneous tissue: Secondary | ICD-10-CM

## 2022-08-02 ENCOUNTER — Encounter (INDEPENDENT_AMBULATORY_CARE_PROVIDER_SITE_OTHER): Payer: Self-pay | Admitting: Surgery

## 2022-08-02 DIAGNOSIS — I7121 Aneurysm of the ascending aorta, without rupture (CMS HCC): Secondary | ICD-10-CM | POA: Insufficient documentation

## 2022-08-02 NOTE — Progress Notes (Signed)
GENERAL SURGERY, [facility]  [address]  [place] [address]    History and Physical     Name: Mitchell Burton MRN:  [id number]   Date: 07/22/2022 Age: 76 y.o.            Reason for Visit: Skin Problem (Cyst on back of neck)    History of Present Illness  Mitchell Burton presents today for evaluation of a sebaceous cyst on the back of the neck.  He states it got a little irritated recently but has not become abscessed or drained.  He is had for a long time and it was not bothering him at this point in time.    Positive diabetes    Review of the result(s) of each unique test:  Patient underwent diagnostic testing ( none ) prior to this dates visit.  I have personally reviewed the results and that serves as a component of the medical decision making for this encounter       Review of prior external note(s) from each unique source:  Patients referral to this office including a recent assessment by the referring provider.  This was reviewed by me for this unique office visit for the indication and intent of the referral as well as any pertinent medical or surgical history relevant to the patients independent evaluation by me today.      Patient History  Past Medical History:   Diagnosis Date    Bradycardia     DDD (degenerative disc disease), cervical     Diabetes mellitus, type 2 (CMS HCC)     Esophageal reflux     Heart disease     Hypertension     SCC (squamous cell carcinoma)     Thyroid nodule          History reviewed. No past surgical history pertinent negatives.      Current Outpatient Medications   Medication Sig    aspirin (ECOTRIN) 81 mg Oral Tablet, Delayed Release (E.C.) Take 1 Tablet (81 mg total) by mouth    losartan-hydrochlorothiazide (HYZAAR) 100-25 mg Oral Tablet     metFORMIN (GLUCOPHAGE XR) 500 mg Oral Tablet Sustained Release 24 hr     omeprazole (PRILOSEC) 20 mg Oral Capsule, Delayed Release(E.C.) Take 1 Capsule (20 mg total) by mouth    rosuvastatin  (CRESTOR) 20 mg Oral Tablet     valACYclovir (VALTREX) 1 gram Oral Tablet 1 gm per 1 tablet, ORAL, PRN (As needed), 0 Refill(s)     No Known Allergies  Family Medical History:    None         Social History     Tobacco Use    Smoking status: Never    Smokeless tobacco: Never   Vaping Use    Vaping Use: Never used   Substance Use Topics    Alcohol use: Not Currently    Drug use: Not Currently            Physical Examination:  Vitals:    07/22/22 1550   BP: 139/71   Pulse: 60   Resp: 18   Temp: 36.6 C (97.8 F)   SpO2: 96%   Weight: 86.6 kg (191 lb)   Height: 1.702 m ('5\' 7"'$ )   BMI: 29.98        General: appropriate for age. in no acute distress.    Vital signs are present above and have been reviewed by me     HEENT: Atraumatic, Normocephalic. PERRLA.  EOMI. Nose clear. Throat clear    Lungs: Nonlabored breathing with symmetric expansion. Clear to auscultation bilaterally    Heart:Regular wth respect to rate and rythmn.    Abdomen:Soft. Nontender. Nondistended and benign    Upper back/posterior neck shows a very small sebaceous cyst which is not inflamed or irritated.  The areas not tender.    Extremities: Grossly normal. No major deformities     Neuro:  Grossly normal motor and sensory function    Psychiatric: Alert and oriented to person, place, and time. affect appropriate      Assessment and Plan  Noninfected, asymptomatic sebaceous cyst of the posterior neck/upper back    Since it was not causing the patient any problems this point in time, I would recommend excision.  I instructed the patient to keep the area clean and free from any irritation and if it does get a little sensitive to apply antibiotic ointment to the skin and hot compresses.  If he has any further problems, he may notify me so we can re-evaluate and plan excision.  Otherwise we will take a watch and see attitude.  He was very comfortable with that approach.      Follow Up:  Return if symptoms worsen or fail to improve.      ICD-10-CM    1.  History of sebaceous cyst  Z87.2           Mitchell Burton B Glendoris Nodarse, MD ,MBA,FACS    I appreciate the opportunity to be involved in the care of your patients.  If you have any questions or concerns regarding this encounter, please do not hesitate to contact me at your convenience.      This note may have been partially generated using [employer] Direct system, and there may be some incorrect words, spellings, and punctuation that were not noted in checking the note before saving, though effort was made to avoid such errors.

## 2023-06-10 IMAGING — DX XRAY KNEE 4 OR MORE VIEWS RT
1 series · 5 of 5 positions shown · non-contrast
Comparison: None available.

﻿EXAM:  [id number]   XRAY KNEE 4 OR MORE VIEWS RT
INDICATION: Right knee pain.

[Series 1: AP · 0.14mm/px · 5 of 5 slices shown]
[im 1/5]
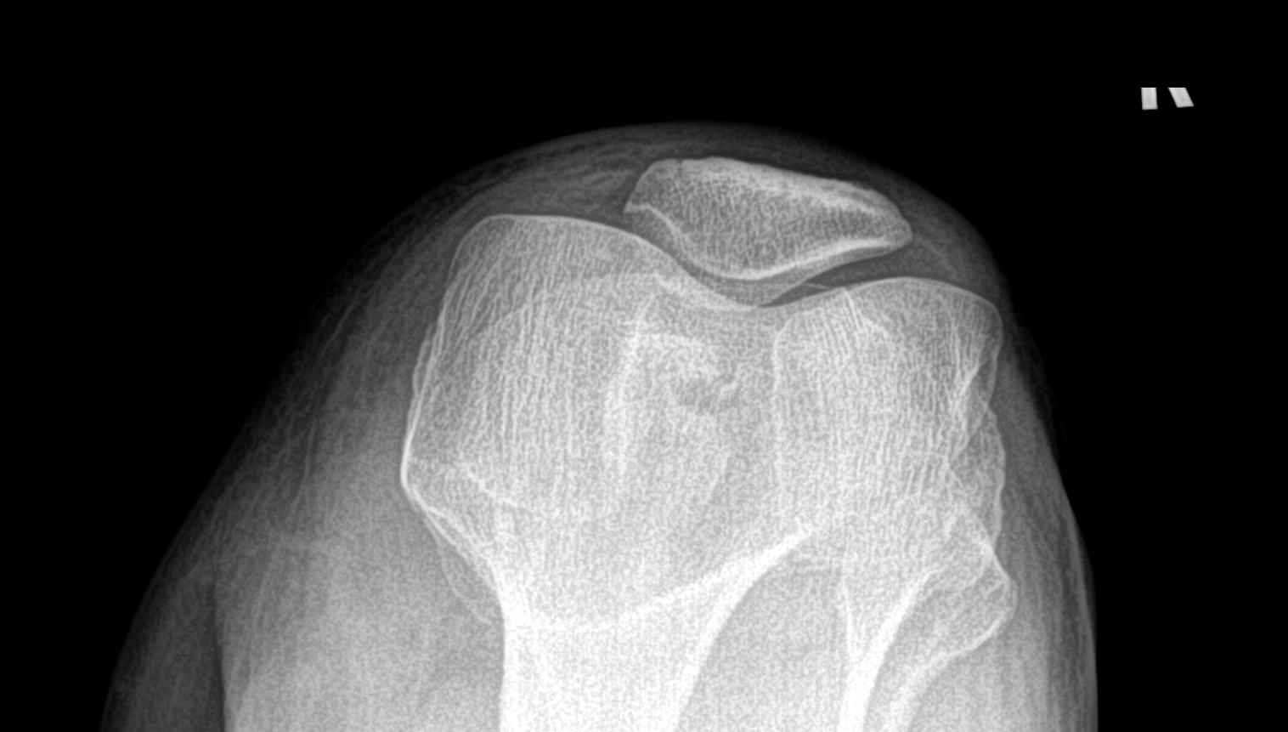
[im 2/5]
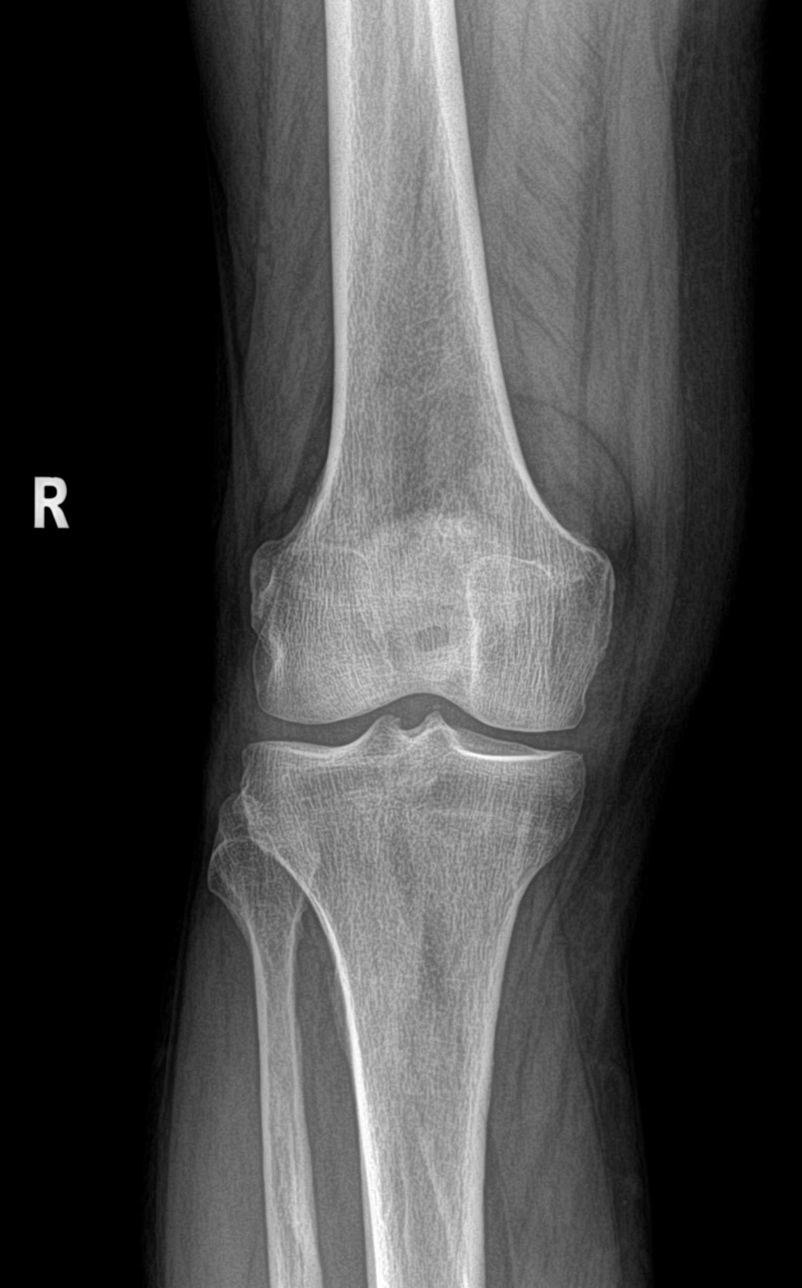
[im 3/5]
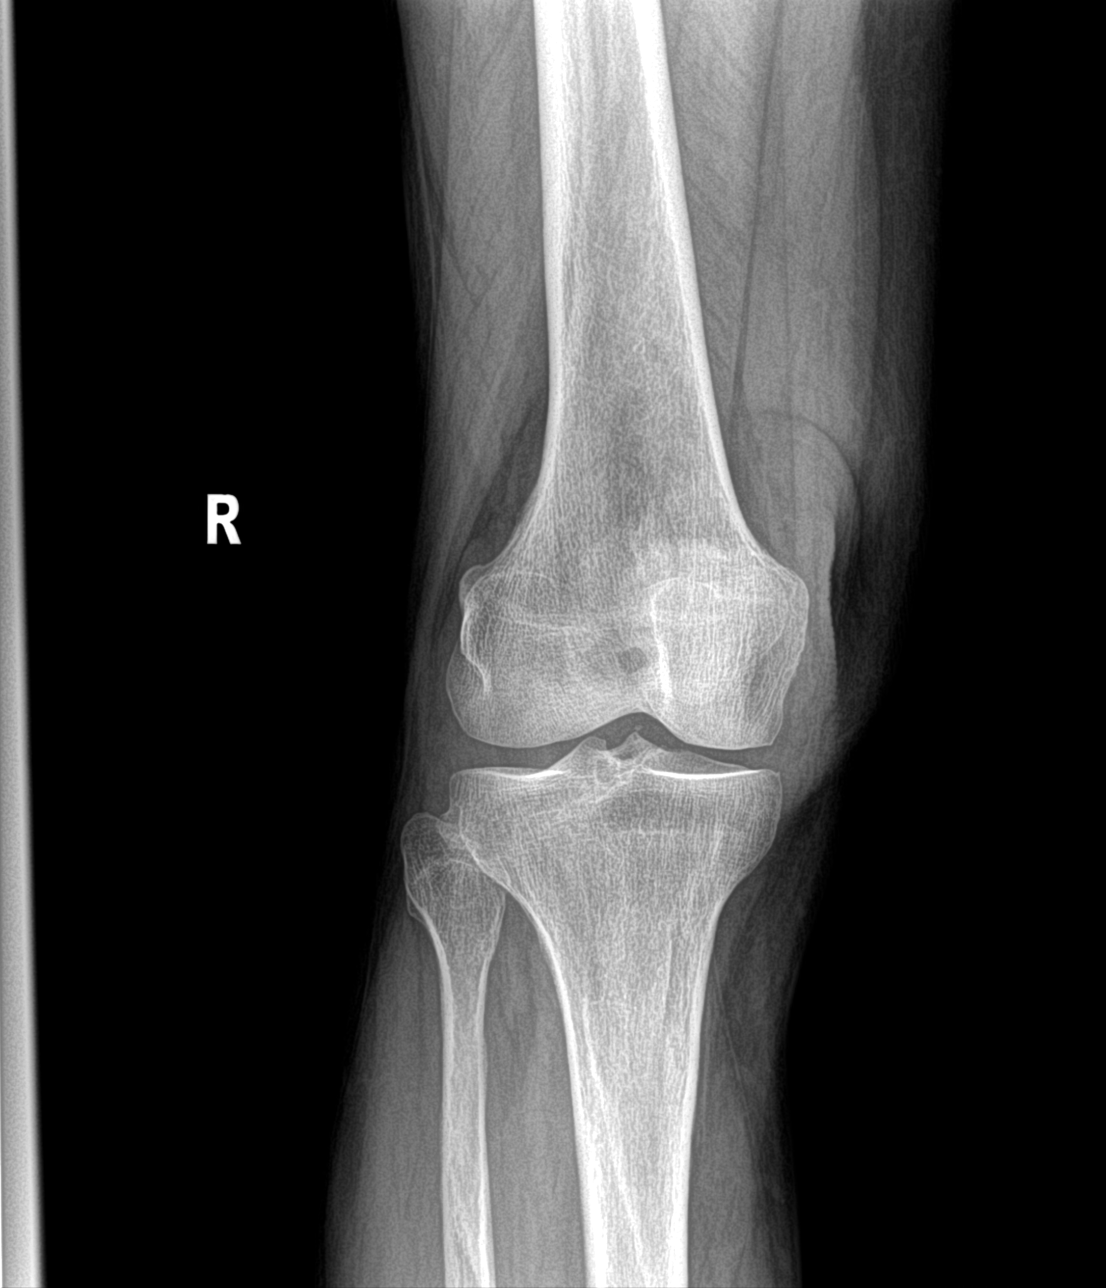
[im 4/5]
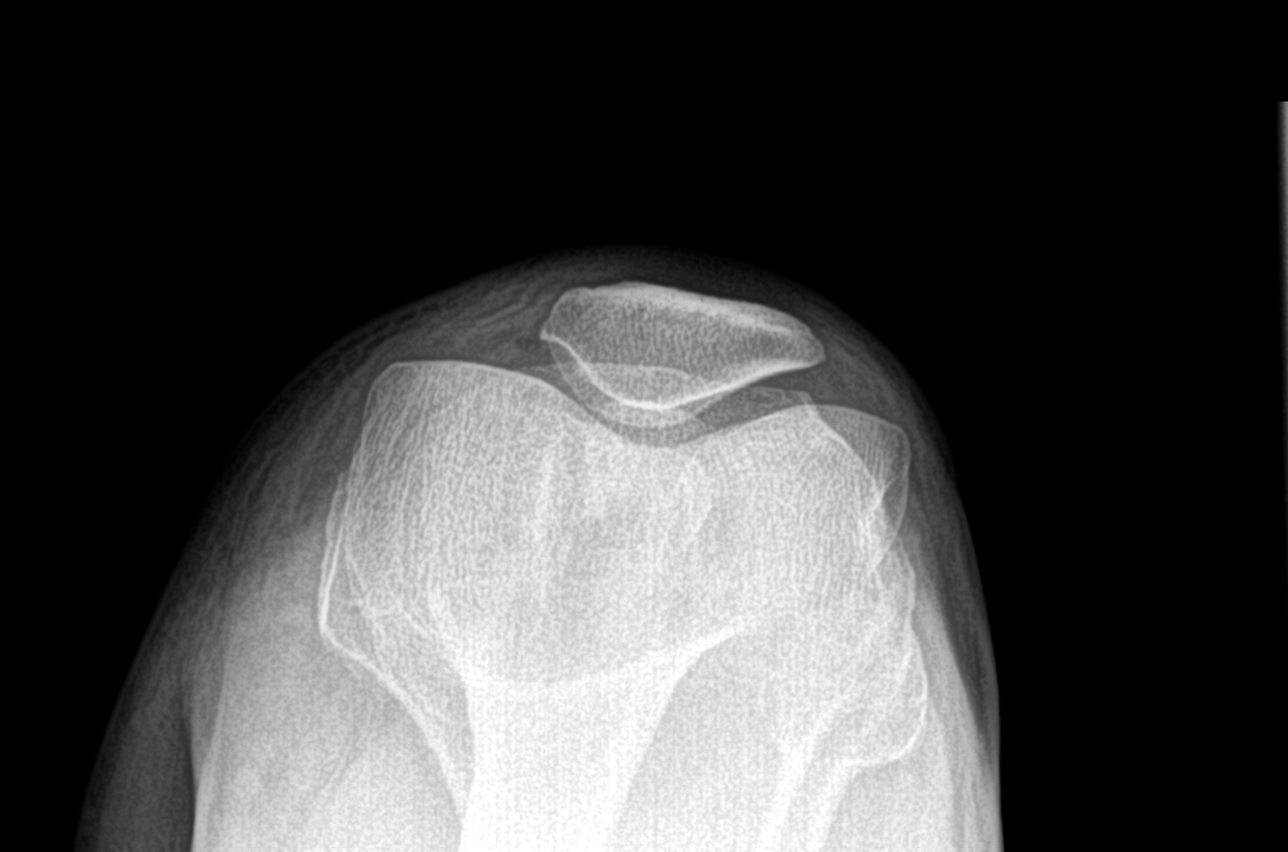
[im 5/5]
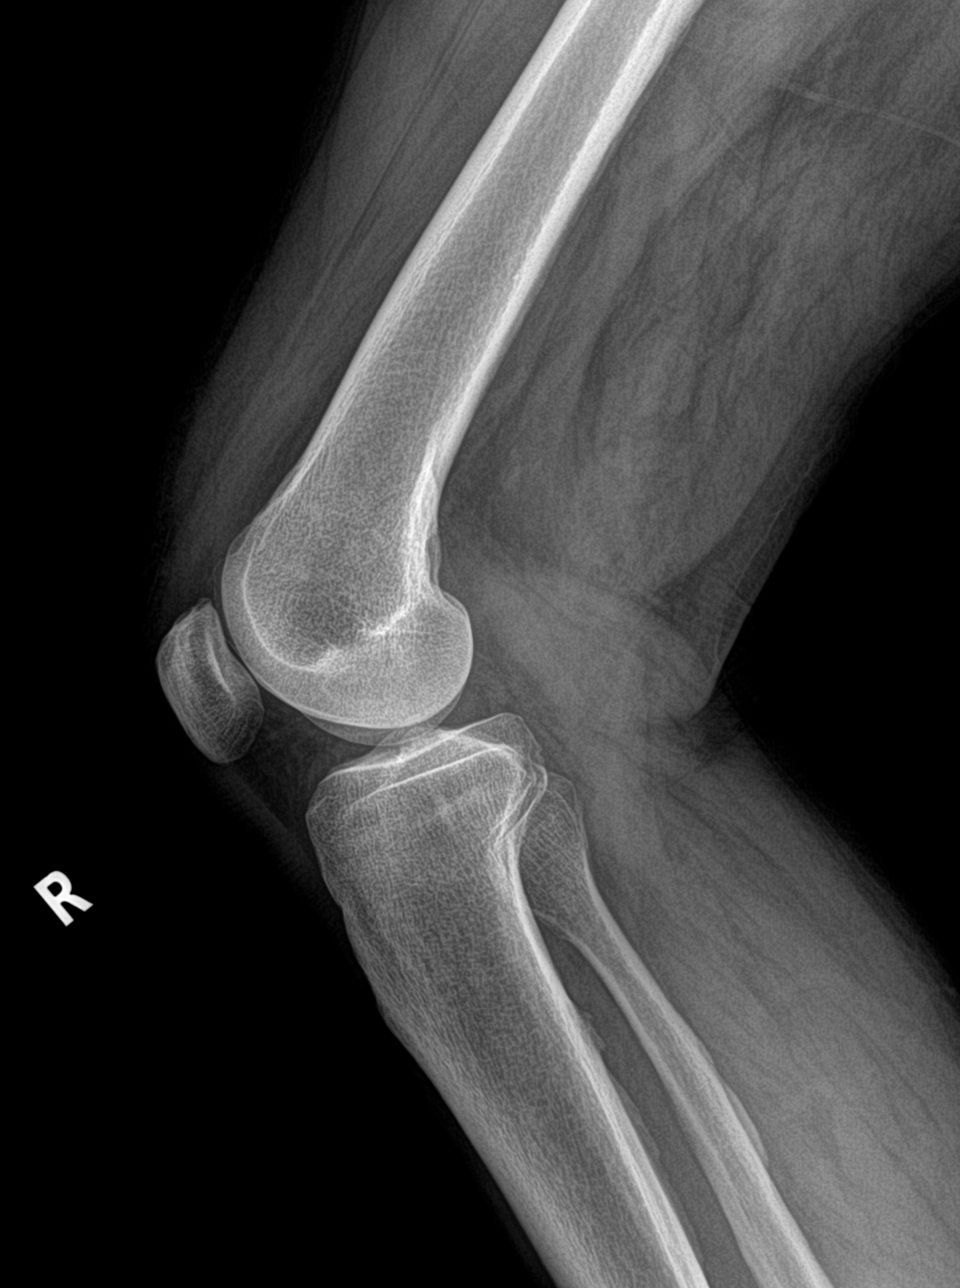

[5 of 5 positions shown; findings below may reference images not displayed]

FINDINGS: There is no fracture, dislocation, lytic or blastic bony lesion, significant degenerative change, or joint effusion.
IMPRESSION: Normal examination.

## 2023-12-05 LAB — COLOGUARD® COLON CANCER SCREEN: COLOGUARD RESULT: POSITIVE — AB

## 2023-12-05 LAB — LAB: COLOGUARD RESULT: POSITIVE — AB

## 2023-12-31 ENCOUNTER — Encounter (INDEPENDENT_AMBULATORY_CARE_PROVIDER_SITE_OTHER): Payer: Self-pay | Admitting: Surgery

## 2024-01-05 ENCOUNTER — Encounter (INDEPENDENT_AMBULATORY_CARE_PROVIDER_SITE_OTHER): Payer: Self-pay | Admitting: Surgery

## 2024-01-05 ENCOUNTER — Ambulatory Visit (INDEPENDENT_AMBULATORY_CARE_PROVIDER_SITE_OTHER): Payer: Self-pay | Admitting: Surgery

## 2024-01-05 ENCOUNTER — Other Ambulatory Visit: Payer: Self-pay

## 2024-01-05 VITALS — BP 139/75 | HR 54 | Temp 97.9°F | Resp 18 | Ht 67.0 in | Wt 185.0 lb

## 2024-01-05 DIAGNOSIS — R195 Other fecal abnormalities: Secondary | ICD-10-CM

## 2024-01-05 MED ORDER — SODIUM SUL 1.479 GRAM-POTAS CH 0.188 GRAM-MAGNES SUL 0.225 GRAM TABLET
ORAL_TABLET | ORAL | 0 refills | Status: DC
Start: 2024-01-05 — End: 2024-03-02

## 2024-01-07 NOTE — Progress Notes (Signed)
 GENERAL SURGERY, [facility] GENERAL SURGERY  [address]  [place] [address]    History and Physical    Name: Mitchell Burton MRN:  [id number]   Date: 01/05/2024 DOB:  August 08, 1946 (78 y.o.)              Reason for Visit: Colonoscopy    History of Present Illness  Mitchell Burton presents today for colonoscopy because of positive Cologuard.  The patient denies any problems.  His last colonoscopy was 12 years ago.    Positive diabetes: Metformin   Negative blood thinner, negative family history colon cancer      Review of the result(s) of each unique test:  Patient underwent diagnostic testing ( none) prior to this dates visit.  I have personally reviewed the results and that serves as a component of the medical decision making for this encounter       Review of prior external note(s) from each unique source:  Patients referral to this office including a recent assessment by the referring provider.  This was reviewed by me for this unique office visit for the indication and intent of the referral as well as any pertinent medical or surgical history relevant to the patients independent evaluation by me today.      Patient History  Past Medical History:   Diagnosis Date    Bradycardia     DDD (degenerative disc disease), cervical     Diabetes mellitus, type 2     Esophageal reflux     Heart disease     Hypertension     SCC (squamous cell carcinoma)     Thyroid nodule          Past Surgical History:   Procedure Laterality Date    COLONOSCOPY      KNEE ARTHROSCOPY W/ MENISCAL REPAIR      SKIN LESION EXCISION           Current Outpatient Medications   Medication Sig    aspirin (ECOTRIN) 81 mg Oral Tablet, Delayed Release (E.C.) Take 1 Tablet (81 mg total) by mouth    losartan-hydrochlorothiazide (HYZAAR) 100-25 mg Oral Tablet     metFORMIN (GLUCOPHAGE XR) 500 mg Oral Tablet Sustained Release 24 hr     omeprazole (PRILOSEC) 20 mg Oral Capsule, Delayed Release(E.C.) Take 1 Capsule (20 mg total) by mouth     rosuvastatin (CRESTOR) 20 mg Oral Tablet     sod sulf-pot chloride-mag sulf 1.479-0.188- 0.225 gram Oral Tablet Take 12 pills at 10-10:30 am. Take 12 pills at 6-6:30pm. Drink plenty of fluids all day/evening.    valACYclovir (VALTREX) 1 gram Oral Tablet 1 gm per 1 tablet, ORAL, PRN (As needed), 0 Refill(s)     No Known Allergies  Family Medical History:    None         Social History     Tobacco Use    Smoking status: Never    Smokeless tobacco: Never   Vaping Use    Vaping status: Never Used   Substance Use Topics    Alcohol use: Not Currently    Drug use: Not Currently            Physical Examination:  Vitals:    01/05/24 1550   BP: 139/75   Pulse: 54   Resp: 18   Temp: 36.6 C (97.9 F)   SpO2: 98%   Weight: 83.9 kg (185 lb)   Height: 1.702 m (5\' 7" )   BMI: 28.98  General: appropriate for age. in no acute distress.    Vital signs are present above and have been reviewed by me     HEENT: Atraumatic, Normocephalic. PERRLA. EOMI. Nose clear. Throat clear    Lungs: Nonlabored breathing with symmetric expansion. Clear to auscultation bilaterally    Heart:Regular wth respect to rate and rythmn.    Abdomen:Soft. Nontender. Nondistended and benign    Extremities: Grossly normal. No major deformities     Neuro:  Grossly normal motor and sensory function    Psychiatric: Alert and oriented to person, place, and time. affect appropriate      Assessment and Plan  Colonoscopy scheduled for 03/02/2024 at 12:00 p.m.     Discussed indications, risks, and benefits of colonoscopy with possible biopsy/polypectomy with the patient.  Discussed the possibility of polypectomy, biopsies, and possible repeat examinations.  Risks include bleeding, sedation risks, possibility of missed diagnosis of polyp or malignancy, and remote possibilities of perforation and death.  All questions were answered, and informed consent was clearly obtained.      Follow Up:  No follow-ups on file.      ICD-10-CM    1. Positive colorectal cancer  screening using Cologuard test  R19.5           Haily Caley B Shaunita Seney, MD ,MBA,FACS    I appreciate the opportunity to be involved in the care of your patients.  If you have any questions or concerns regarding this encounter, please do not hesitate to contact me at your convenience.      This note may have been partially generated using MModal Fluency Direct system, and there may be some incorrect words, spellings, and punctuation that were not noted in checking the note before saving, though effort was made to avoid such errors.

## 2024-03-02 ENCOUNTER — Encounter (HOSPITAL_COMMUNITY)
Admission: RE | Disposition: A | Discharge status: Home / Self Care | Payer: Self-pay | Source: Ambulatory Visit | Attending: Surgery

## 2024-03-02 ENCOUNTER — Ambulatory Visit
Admission: RE | Admit: 2024-03-02 | Discharge: 2024-03-02 | Disposition: A | Discharge status: Home / Self Care | Source: Ambulatory Visit | Attending: Surgery | Admitting: Surgery

## 2024-03-02 ENCOUNTER — Other Ambulatory Visit: Payer: Self-pay

## 2024-03-02 ENCOUNTER — Encounter (HOSPITAL_COMMUNITY): Payer: Self-pay | Admitting: Surgery

## 2024-03-02 ENCOUNTER — Ambulatory Visit (HOSPITAL_COMMUNITY): Admitting: Certified Registered"

## 2024-03-02 ENCOUNTER — Ambulatory Visit (HOSPITAL_COMMUNITY): Admitting: Surgery

## 2024-03-02 DIAGNOSIS — R195 Other fecal abnormalities: Secondary | ICD-10-CM | POA: Insufficient documentation

## 2024-03-02 DIAGNOSIS — I10 Essential (primary) hypertension: Secondary | ICD-10-CM | POA: Insufficient documentation

## 2024-03-02 DIAGNOSIS — K219 Gastro-esophageal reflux disease without esophagitis: Secondary | ICD-10-CM | POA: Insufficient documentation

## 2024-03-02 DIAGNOSIS — E119 Type 2 diabetes mellitus without complications: Secondary | ICD-10-CM | POA: Insufficient documentation

## 2024-03-02 DIAGNOSIS — Z7984 Long term (current) use of oral hypoglycemic drugs: Secondary | ICD-10-CM | POA: Insufficient documentation

## 2024-03-02 DIAGNOSIS — Z1211 Encounter for screening for malignant neoplasm of colon: Secondary | ICD-10-CM | POA: Insufficient documentation

## 2024-03-02 HISTORY — DX: Aortic aneurysm of unspecified site, without rupture: I71.9

## 2024-03-02 SURGERY — COLONOSCOPY
Anesthesia: General | Wound class: Clean Contaminated Wounds-The respiratory, GI, Genital, or urinary

## 2024-03-02 MED ORDER — PROPOFOL 10 MG/ML INTRAVENOUS EMULSION
INTRAVENOUS | Status: AC
Start: 2024-03-02 — End: 2024-03-02
  Filled 2024-03-02: qty 20

## 2024-03-02 MED ORDER — DEXTROSE 5 % AND LACTATED RINGERS INTRAVENOUS SOLUTION
INTRAVENOUS | Status: DC | PRN
Start: 2024-03-02 — End: 2024-03-02

## 2024-03-02 MED ORDER — GLYCOPYRROLATE 0.2 MG/ML INJECTION SOLUTION
INTRAMUSCULAR | Status: AC
Start: 2024-03-02 — End: 2024-03-02
  Filled 2024-03-02: qty 1

## 2024-03-02 MED ORDER — PROPOFOL 10 MG/ML IV BOLUS
INJECTION | Freq: Once | INTRAVENOUS | Status: DC | PRN
Start: 2024-03-02 — End: 2024-03-02
  Administered 2024-03-02 (×2): 20 mg via INTRAVENOUS
  Administered 2024-03-02: 50 mg via INTRAVENOUS
  Administered 2024-03-02: 20 mg via INTRAVENOUS
  Administered 2024-03-02: 10 mg via INTRAVENOUS
  Administered 2024-03-02: 20 mg via INTRAVENOUS
  Administered 2024-03-02: 10 mg via INTRAVENOUS
  Administered 2024-03-02: 20 mg via INTRAVENOUS

## 2024-03-02 MED ORDER — GLYCOPYRROLATE 0.2 MG/ML INJECTION SOLUTION
Freq: Once | INTRAMUSCULAR | Status: DC | PRN
Start: 2024-03-02 — End: 2024-03-02
  Administered 2024-03-02: .2 mg via INTRAVENOUS

## 2024-03-02 MED ORDER — DEXTROSE 5 % AND LACTATED RINGERS INTRAVENOUS SOLUTION
INTRAVENOUS | Status: DC
Start: 2024-03-02 — End: 2024-03-02

## 2024-03-02 SURGICAL SUPPLY — 1 items: CLEANER INSTRUMENT PRE-KLENZ 13.5 OZ (MISCELLANEOUS PT CARE ITEMS) ×1 IMPLANT

## 2024-03-02 NOTE — H&P (Signed)
 [facility]  General Surgery  History and Physical    Date of Service:  03/02/2024  Mitchell Burton, Mitchell Burton, 78 y.o. male  Date of Admission:  03/02/2024  Date of Birth:  1946/08/03  PCP: Mitchell Ledger, FNP    Reason for admission:  Colonoscopy    HPI:  Mitchell Burton is a 78 y.o. White male who is admitted for Positive Cologuard     Mitchell Burton presents today for colonoscopy because of positive Cologuard.  The patient denies any problems.  His last colonoscopy was 12 years ago.     Positive diabetes: Metformin   Negative blood thinner, negative family history colon cancer        Review of the result(s) of each unique test:  Patient underwent diagnostic testing ( none) prior to this dates visit.  I have personally reviewed the results and that serves as a component of the medical decision making for this encounter        Review of prior external note(s) from each unique source:  Patients referral to this office including a recent assessment by the referring provider.  This was reviewed by me for this unique office visit for the indication and intent of the referral as well as any pertinent medical or surgical history relevant to the patients independent evaluation by me today.    Past Medical History:   Diagnosis Date    Aortic aneurysm     Bradycardia     DDD (degenerative disc disease), cervical     Diabetes mellitus, type 2     Esophageal reflux     Heart disease     Hypertension     SCC (squamous cell carcinoma)     Thyroid nodule       Past Surgical History:   Procedure Laterality Date    COLONOSCOPY      KNEE ARTHROSCOPY W/ MENISCAL REPAIR Left     SKIN LESION EXCISION        Social History[1]    Family Medical History:    None        Medications Prior to Admission       Prescriptions    aspirin (ECOTRIN) 81 mg Oral Tablet, Delayed Release (E.C.)    Take 1 Tablet (81 mg total) by mouth    losartan-hydrochlorothiazide (HYZAAR) 100-25 mg Oral Tablet    metFORMIN (GLUCOPHAGE XR) 500 mg Oral  Tablet Sustained Release 24 hr    omeprazole (PRILOSEC) 20 mg Oral Capsule, Delayed Release(E.C.)    Take 1 Capsule (20 mg total) by mouth    rosuvastatin (CRESTOR) 20 mg Oral Tablet    valACYclovir (VALTREX) 1 gram Oral Tablet    1 gm per 1 tablet, ORAL, PRN (As needed), 0 Refill(s)           Allergies[2]       Patient Vitals for the past 24 hrs:   BP Temp Pulse Resp SpO2 Height Weight   03/02/24 1356 (!) 143/76 -- 50 19 100 % -- --   03/02/24 1341 112/61 -- 50 19 99 % -- --   03/02/24 1326 (!) 105/55 36.4 C (97.5 F) (!) 47 17 99 % -- --   03/02/24 1157 (!) 156/79 36.8 C (98.2 F) (!) 43 15 98 % 1.702 m (5' 7) 83.9 kg (185 lb)          General: appropriate for age. in no acute distress.    Vital signs are present above and have been reviewed  by me     HEENT: Atraumatic, Normocephalic. PERRLA, EOMI. Nose clear. Throat clear.    Lungs: Nonlabored breathing with symmetric expansion.  Clear to auscultation bilaterally    Heart:Regular wth respect to rate and rythmn.    Abdomen:Soft. Nontender. Nondistended and benign    Extremities:  Grossly normal with good range of motion and no major deformities.    Neuro:  Grossly normal motor and sensory function. CN's II through XII intact.    Psychiatric: Alert and oriented to person, place, and time. affect appropriate    Laboratory Data:     No results found for any visits on 03/02/24 (from the past 24 hours).    Imaging Studies:    No orders to display        Assessment/Plan:  Positive Cologuard    Colonoscopy scheduled for 03/02/2024 at 12:00 p.m.      Discussed indications, risks, and benefits of colonoscopy with possible biopsy/polypectomy with the patient.  Discussed the possibility of polypectomy, biopsies, and possible repeat examinations.  Risks include bleeding, sedation risks, possibility of missed diagnosis of polyp or malignancy, and remote possibilities of perforation and death.  All questions were answered, and informed consent was clearly obtained.    This  note was partially created using voice recognition software and is inherently subject to errors including those of syntax and sound alike  substitutions which may escape proof reading. In such instances, original meaning may be extrapolated by contextual derivation.    Mitchell Lewan B Evolet Salminen, MD, MBA, FACS         [1]   Social History  Tobacco Use    Smoking status: Never    Smokeless tobacco: Never   Vaping Use    Vaping status: Never Used   Substance Use Topics    Alcohol use: Not Currently    Drug use: Not Currently   [2] No Known Allergies

## 2024-03-02 NOTE — Discharge Instructions (Signed)
 SURGICAL DISCHARGE INSTRUCTIONS     Dr. Cathay Clonts, Gene B, MD  performed your COLONOSCOPY today at the [facility]    [facility]:  Monday through Friday from 8 a.m. - 4 p.m.: [phone]    For T&D: [phone]  Between 4 p.m. - 8 a.m., weekends and holidays:  Call ER [phone]    PLEASE SEE WRITTEN HANDOUTS AS DISCUSSED BY YOUR NURSE    SIGNS AND SYMPTOMS OF A WOUND / INCISION INFECTION   Be sure to watch for the following:  Increase in redness or red streaks near or around the wound or incision.  Increase in pain that is intense or severe and cannot be relieved by the pain medication that your doctor has given you.  Increase in swelling that cannot be relieved by elevation of a body part, or by applying ice, if permitted.  Increase in drainage, or if yellow / green in color and smells bad. This could be on a dressing or a cast.  Increase in fever for longer than 24 hours, or an increase that is higher than 101 degrees Fahrenheit (normal body temperature is 98 degrees Fahrenheit). The incision may feel warm to the touch.    **CALL YOUR DOCTOR IF ONE OR MORE OF THESE SIGNS / SYMPTOMS SHOULD OCCUR.    ANESTHESIA INFORMATION   ANESTHESIA -- ADULT PATIENTS:  You have received intravenous sedation / general anesthesia, and you may feel drowsy and light-headed for several hours. You may even experience some forgetfulness of the procedure. DO NOT DRIVE A MOTOR VEHICLE or perform any activity requiring complete alertness or coordination until you feel fully awake in about 24-48 hours. Do not drink alcoholic beverages for at least 24 hours. Do not stay alone, you must have a responsible adult available to be with you. You may also experience a dry mouth or nausea for 24 hours. This is a normal side effect and will disappear as the effects of the medication wear off.    REMEMBER   If you experience any difficulty breathing, chest pain, bleeding that you feel is excessive,  persistent nausea or vomiting or for any other concerns:  Call your physician Dr.  Cathay Clonts, Tomas Fountain, MD   at [phone] . You may also ask to have the general doctor on call paged. They are available to you 24 hours a day.      SPECIAL INSTRUCTIONS / COMMENTS   Today's results: normal colonoscopy    FOLLOW-UP APPOINTMENTS   Follow up with Dr Arletha Lady as needed  Repeat colonoscopy in 10 years. Placed on list in office-they will call when time to schedule.     Dr Lajoyce Pikes  [phone]

## 2024-03-02 NOTE — Anesthesia Postprocedure Evaluation (Signed)
 Anesthesia Post Op Evaluation    Patient: Mitchell Burton  Procedure(s):  COLONOSCOPY    Last Vitals:Temperature: 36.8 C (98.2 F) (03/02/24 1157)  Heart Rate: (!) 43 (03/02/24 1157)  BP (Non-Invasive): (!) 156/79 (03/02/24 1157)  Respiratory Rate: 15 (03/02/24 1157)  SpO2: 98 % (03/02/24 1157)    No notable events documented.    Patient is sufficiently recovered from the effects of anesthesia to participate in the evaluation and has returned to their pre-procedure level.  Patient location during evaluation: bedside         Pain score: 0  Pain management: adequate    Anesthetic complications: no  Cardiovascular status: acceptable  Respiratory status: acceptable  Hydration status: acceptable  Patient post-procedure temperature: Pt Normothermic   PONV Status: Absent

## 2024-03-02 NOTE — OR Surgeon (Signed)
 [facility]      Patient Name: Mitchell Burton, Mitchell Burton Number: [id number]  Date of Service: 03/02/2024   Date of Birth: 1945/11/05      Pre-Operative Diagnosis: Positive Cologuard     Post-Operative Diagnosis: Normal Colonoscopy    Procedure(s)/Description:  COLONOSCOPY: 65784 (CPT)       Attending Surgeon: Eliot Guernsey, MD     Anesthesia:  CRNA: Dillen, Amanda, CRNA    Anesthesia Type: .General     Specimens Removed: NONE    The patient indicates that they have read and understood the preoperative colonoscopy consent form. The benefits, risks and alternatives to the procedure were discussed. I specifically discussed the risk of bleeding and/or perforation requiring operation.  The patient was given ample opportunity to ask questions which were addressed to the patient's satisfaction.  The patient indicates they have no further question and wish to proceed. Informed consent was obtained from the patient and/or medical power of attorney.    The patient was brought into the procedure room and placed on the table in the left lateral decubitus position. After IV sedation was given, full finger digital rectal examination was performed with a circumferential sweep of the distal rectal mucosa. Subsequently, the flexible colonoscope was inserted into the rectum and passed without any difficulty. The colonoscope was then advanced up into the sigmoid colon, descending colon, transverse colon, right colon and cecum without any difficulty. Gross examination of each section of the colon was performed showing no specific abnormalities seen. Cecal intubation was achieved and the appendiceal orifice and ileocecal valve were identified. The colonoscope was withdrawn carefully examining the mucosa as the scope was being extracted with particular attention paid to the proximal sides of folds, flexures, bends and rectal valves. At approximately 10 cm. from the anal verge, the colonoscope was retroflexed to fully  examine the distal rectum. The colonoscope was removed and a repeat digital rectal examination was performed at the completion of the procedure. The patient tolerated the procedure well. No intraoperative complications were encountered.    EKG, pulse, pulse oximetry and blood pressure were monitored throughout the entire procedure.  There were no unplanned events.  The patient was instructed to contact me if they have any problems with their colon such as bleeding, pain or changes in bowel habits. They understood and agreed to do so.  The patient will not need another screening colonoscopy for 10 years which is according to ASGE guidelines. However, if in the future the patient has any problems with abdominal pain, changes in bowel habits, blood in stool, etc., then they should contact me because they may be a candidate for diagnostic colonoscopy before the 10 year time limit.    Dameka Younker B. Khaleem Burchill, MD, MBA, FACS  [facility] -General Surgery

## 2024-03-02 NOTE — Anesthesia Preprocedure Evaluation (Signed)
 ANESTHESIA PRE-OP EVALUATION  Planned Procedure: COLONOSCOPY  Review of Systems     anesthesia history negative     patient summary reviewed  nursing notes reviewed        Pulmonary  negative pulmonary ROS,    Cardiovascular    Hypertension and well controlled ,No peripheral edema,  Exercise Tolerance: > or = 4 METS        GI/Hepatic/Renal    GERD and well controlled        Endo/Other      type 2 diabetes/ stable/ controlled with oral medications    Neuro/Psych/MS        Cancer    negative hematology/oncology ROS,                     Physical Assessment      Airway       Mallampati: I    TM distance: >3 FB    Neck ROM: full  Mouth Opening: good.  Facial hair  No Beard        Dental                    Pulmonary    Breath sounds clear to auscultation       Cardiovascular    Rhythm: regular  Rate: Normal  (-) no friction rub, carotid bruit is not present, no peripheral edema and no murmur     Other findings              Plan  ASA 2     Planned anesthesia type: general     total intravenous anesthesia                    Intravenous induction     Anesthesia issues/risks discussed are: Dental Injuries and PONV.  Anesthetic plan and risks discussed with  signed consent obtained      Use of blood products discussed with who consented to blood products.

## 2024-03-03 ENCOUNTER — Telehealth (INDEPENDENT_AMBULATORY_CARE_PROVIDER_SITE_OTHER): Payer: Self-pay | Admitting: Surgery

## 2024-08-11 ENCOUNTER — Other Ambulatory Visit (HOSPITAL_COMMUNITY): Payer: Self-pay | Admitting: Orthopaedic Surgery

## 2024-08-11 DIAGNOSIS — M25562 Pain in left knee: Secondary | ICD-10-CM

## 2024-09-27 ENCOUNTER — Other Ambulatory Visit: Payer: Self-pay

## 2024-09-27 ENCOUNTER — Ambulatory Visit
Admission: RE | Admit: 2024-09-27 | Discharge: 2024-09-27 | Disposition: A | Discharge status: Home / Self Care | Source: Ambulatory Visit | Attending: Orthopaedic Surgery | Admitting: Orthopaedic Surgery

## 2024-09-27 DIAGNOSIS — M25562 Pain in left knee: Secondary | ICD-10-CM | POA: Insufficient documentation

## 2024-09-30 ENCOUNTER — Other Ambulatory Visit (HOSPITAL_COMMUNITY): Payer: Self-pay | Admitting: Orthopaedic Surgery

## 2024-09-30 ENCOUNTER — Other Ambulatory Visit: Payer: Self-pay

## 2024-09-30 ENCOUNTER — Ambulatory Visit
Admission: RE | Admit: 2024-09-30 | Discharge: 2024-09-30 | Disposition: A | Source: Ambulatory Visit | Attending: Orthopaedic Surgery

## 2024-09-30 ENCOUNTER — Ambulatory Visit: Attending: Orthopaedic Surgery

## 2024-09-30 DIAGNOSIS — Z01818 Encounter for other preprocedural examination: Secondary | ICD-10-CM

## 2024-09-30 DIAGNOSIS — Z131 Encounter for screening for diabetes mellitus: Secondary | ICD-10-CM | POA: Insufficient documentation

## 2024-09-30 LAB — URINALYSIS, MACROSCOPIC
BILIRUBIN: NEGATIVE mg/dL
BLOOD: NEGATIVE mg/dL
GLUCOSE: NEGATIVE mg/dL
KETONES: NEGATIVE mg/dL
LEUKOCYTE ESTERASE: NEGATIVE WBCs/uL
NITRITE: NEGATIVE
PH: 7 (ref 5.0–8.0)
PROTEIN: NEGATIVE mg/dL
SPECIFIC GRAVITY: 1.01 (ref 1.005–1.030)
UROBILINOGEN: NEGATIVE mg/dL

## 2024-09-30 LAB — BASIC METABOLIC PANEL
ANION GAP: 7 mmol/L (ref 4–13)
BUN/CREA RATIO: 14 (ref 6–22)
BUN: 13 mg/dL (ref 7–25)
CALCIUM: 9.7 mg/dL (ref 8.6–10.3)
CHLORIDE: 102 mmol/L (ref 98–107)
CREATININE: 0.93 mg/dL (ref 0.60–1.30)
ESTIMATED GFR: 85 mL/min/1.73mˆ2 (ref >59)
GLUCOSE: 135 mg/dL — ABNORMAL HIGH (ref 74–109)
OSMOLALITY, CALCULATED: 284 mosm/kg (ref 270–290)
POTASSIUM: 4.1 mmol/L (ref 3.5–5.1)
SODIUM: 141 mmol/L (ref 136–145)

## 2024-09-30 LAB — CBC
HCT: 43 % (ref 36.7–47.1)
HGB: 14.8 g/dL (ref 12.5–16.3)
MCH: 32.1 pg (ref 23.8–33.4)
MCHC: 34.4 g/dL (ref 32.5–36.3)
MCV: 93.3 fL (ref 73.0–96.2)
MPV: 7.9 fL (ref 7.4–11.4)
PLATELETS: 304 x10ˆ3/uL (ref 140–440)
RBC: 4.61 x10ˆ6/uL (ref 4.06–5.63)
RDW: 13.3 % (ref 12.1–16.2)
WBC: 7.1 x10ˆ3/uL (ref 3.6–10.2)

## 2024-09-30 LAB — URINALYSIS, MICROSCOPIC

## 2024-10-01 DIAGNOSIS — R001 Bradycardia, unspecified: Secondary | ICD-10-CM

## 2024-10-01 LAB — ECG 12 LEAD
Atrial Rate: 56 {beats}/min
Calculated P Axis: 65 degrees
Calculated R Axis: 13 degrees
Calculated T Axis: 37 degrees
PR Interval: 172 ms
QRS Duration: 80 ms
QT Interval: 378 ms
QTC Calculation: 364 ms
Ventricular rate: 56 {beats}/min

## 2024-11-11 ENCOUNTER — Encounter (HOSPITAL_COMMUNITY): Payer: Self-pay

## 2024-11-11 ENCOUNTER — Inpatient Hospital Stay: Admit: 2024-11-11 | Admitting: Orthopaedic Surgery
